# Patient Record
Sex: Male | Born: 1955 | Race: Black or African American | Hispanic: No
Health system: Midwestern US, Community
[De-identification: ages and names within clinical notes are randomized; demographics above are authoritative.]

## PROBLEM LIST (undated history)

## (undated) DIAGNOSIS — R1310 Dysphagia, unspecified: Secondary | ICD-10-CM

## (undated) DIAGNOSIS — I639 Cerebral infarction, unspecified: Secondary | ICD-10-CM

## (undated) DIAGNOSIS — M1990 Unspecified osteoarthritis, unspecified site: Secondary | ICD-10-CM

## (undated) DIAGNOSIS — Z86010 Personal history of colonic polyps: Principal | ICD-10-CM

## (undated) HISTORY — PX: HEMORROIDECTOMY: SUR656

## (undated) HISTORY — PX: TOE SURGERY: SHX1073

## (undated) HISTORY — PX: HERNIA REPAIR: SHX51

## (undated) HISTORY — DX: Personal history of colonic polyps: Z86.010

## (undated) HISTORY — PX: COLONOSCOPY: SHX174

## (undated) HISTORY — DX: Unspecified osteoarthritis, unspecified site: M19.90

---

## 2013-08-10 NOTE — Progress Notes (Signed)
Chief Complaint   Patient presents with   ??? New Patient   ??? 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     1. When and where did you last receive medical care? In the 1990s [facility] [place] for general physical    2.Have you had preventive care such as mammogram, pap smears or colon screening? Yes, approx 5 to 6 years    3. What is your current living situation (for example, live alone, live in home with immediate family members)? [facility]    4. Do you have any problems with communication such trouble seeing, hearing, or understanding instructions?No    5. Do you have an advance directive?  This is a document that you can give to family members with instructions for how you would want them to make health care decisions for you if you were unable to speak for yourself.  (For example, unconscious, delerious) No    PMH/FH/Social Hx reviewed and updated as needed     Applicable screenings reviewed and updated as needed  Medication reconciliation performed. Patient does not know if he needs medication refills.  Health Maintenance reviewed.

## 2013-08-10 NOTE — Progress Notes (Signed)
Discharge instructions reviewed with patient    Medication list and understanding of medications reviewed with patient.   OTC and herbal medications reviewed and added to med list if applicable  Barriers to adherence assessed.    Guidance given regarding new medications this visit, including reason for taking this medicine, and common side effects.

## 2013-08-10 NOTE — Progress Notes (Signed)
HPI  Ryan Warner is a 58 y.o. male being seen today for chronic pain due to history of polio causing left leg emaciation, chronic knee pain, unstable gait.    Chief Complaint   Patient presents with   ??? New Patient   ??? 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   .  he states that see HPI    Past Medical History   Diagnosis Date   ??? Arthritis 2003   ??? Polio 1988   ??? Asbestos exposure          ROS  Patient states that he is feeling well. Denies complaints of chest pain, shortness of breath, swelling of legs, dizziness or weakness. he denies nausea, vomiting or diarrhea.        Current Outpatient Prescriptions   Medication Sig   ??? aspirin (ASPIRIN) 325 mg tablet Take 325 mg by mouth daily.     No current facility-administered medications for this visit.       PE  BP 126/85    Pulse 77    Temp(Src) 97.5 ??F (36.4 ??C) (Oral)    Resp 18    Ht 5\' 10"  (1.778 m)    Wt 167 lb (75.751 kg)    BMI 23.96 kg/m2      SpO2 99%      Alert and oriented with normal mood and affect. he is well developed and well nourished . Lungs are clear without wheezing. Heart rate is regular without murmurs or gallops. There is no lower extremity edema.     No results found for this or any previous visit.      Assessment and Plan:        ICD-9-CM   1. Flat foot 734   2. 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 Chronic knee pain 719.46           Cordella RegisterAmanda K Lyza Houseworth, NP

## 2015-03-14 ENCOUNTER — Emergency Department: Admit: 2015-03-14 | Payer: Self-pay

## 2015-03-14 ENCOUNTER — Inpatient Hospital Stay
Admit: 2015-03-14 | Discharge: 2015-03-16 | Disposition: A | Discharge status: Home / Self Care | Payer: Self-pay | Attending: Internal Medicine | Admitting: Internal Medicine

## 2015-03-14 DIAGNOSIS — I639 Cerebral infarction, unspecified: Secondary | ICD-10-CM

## 2015-03-14 LAB — EKG, 12 LEAD, INITIAL
Atrial Rate: 56 {beats}/min
Calculated P Axis: 62 degrees
Calculated R Axis: 39 degrees
Calculated T Axis: 55 degrees
P-R Interval: 350 ms
Q-T Interval: 444 ms
QRS Duration: 88 ms
QTC Calculation (Bezet): 428 ms
Ventricular Rate: 56 {beats}/min

## 2015-03-14 LAB — CBC WITH AUTOMATED DIFF
BASOPHILS: 0.4 % (ref 0–3)
EOSINOPHILS: 1.5 % (ref 0–5)
HCT: 47 % (ref 37.0–50.0)
HGB: 16.1 gm/dl (ref 12.4–17.2)
IMMATURE GRANULOCYTES: 0.2 % (ref 0.0–3.0)
LYMPHOCYTES: 43.8 % (ref 28–48)
MCH: 30.8 pg (ref 23.0–34.6)
MCHC: 34.3 gm/dl (ref 30.0–36.0)
MCV: 90 fL (ref 80.0–98.0)
MONOCYTES: 6.8 % (ref 1–13)
MPV: 8.2 fL (ref 6.0–10.0)
NEUTROPHILS: 47.3 % (ref 34–64)
NRBC: 0 (ref 0–0)
PLATELET: 237 10*3/uL (ref 140–450)
RBC: 5.22 M/uL (ref 3.80–5.70)
RDW-SD: 47.3 — ABNORMAL HIGH (ref 35.1–43.9)
WBC: 5.5 10*3/uL (ref 4.0–11.0)

## 2015-03-14 LAB — POC TROPONIN: Troponin-I: 0 ng/ml (ref 0.00–0.07)

## 2015-03-14 LAB — POC CHEM8
BUN: 11 mg/dl (ref 7–25)
CALCIUM,IONIZED: 4.6 mg/dL (ref 4.40–5.40)
CO2, TOTAL: 26 mmol/L (ref 21–32)
Chloride: 98 mEq/L (ref 98–107)
Creatinine: 1.3 mg/dl (ref 0.6–1.3)
Glucose: 91 mg/dL (ref 74–106)
HCT: 51 % (ref 40–54)
HGB: 17.3 gm/dl — ABNORMAL HIGH (ref 12.4–17.2)
Potassium: 3.7 mEq/L (ref 3.5–4.9)
Sodium: 140 mEq/L (ref 136–145)

## 2015-03-14 LAB — GLUCOSE, POC: Glucose (POC): 88 mg/dL (ref 65–105)

## 2015-03-14 MED ORDER — ASPIRIN 325 MG TAB
325 mg | Freq: Every day | ORAL | Status: DC
Start: 2015-03-14 — End: 2015-03-16
  Administered 2015-03-15 – 2015-03-16 (×2): via ORAL

## 2015-03-14 MED ORDER — OXYCODONE-ACETAMINOPHEN 5 MG-325 MG TAB
5-325 mg | ORAL | Status: DC | PRN
Start: 2015-03-14 — End: 2015-03-16

## 2015-03-14 MED ORDER — NALOXONE 0.4 MG/ML INJECTION
0.4 mg/mL | INTRAMUSCULAR | Status: DC | PRN
Start: 2015-03-14 — End: 2015-03-16

## 2015-03-14 MED ORDER — SODIUM CHLORIDE 0.9 % IJ SYRG
INTRAMUSCULAR | Status: DC | PRN
Start: 2015-03-14 — End: 2015-03-16

## 2015-03-14 MED ORDER — NICOTINE 14 MG/24 HR DAILY PATCH
14 mg/24 hr | TRANSDERMAL | Status: DC
Start: 2015-03-14 — End: 2015-03-16

## 2015-03-14 MED ORDER — INSULIN LISPRO 100 UNIT/ML INJECTION
100 unit/mL | Freq: Four times a day (QID) | SUBCUTANEOUS | Status: DC
Start: 2015-03-14 — End: 2015-03-16

## 2015-03-14 MED ORDER — SODIUM CHLORIDE 0.9 % IJ SYRG
Freq: Three times a day (TID) | INTRAMUSCULAR | Status: DC
Start: 2015-03-14 — End: 2015-03-16
  Administered 2015-03-15 – 2015-03-16 (×5): via INTRAVENOUS

## 2015-03-14 MED ORDER — SODIUM CHLORIDE 0.9 % IJ SYRG
Freq: Once | INTRAMUSCULAR | Status: AC
Start: 2015-03-14 — End: 2015-03-14
  Administered 2015-03-14: 17:00:00 via INTRAVENOUS

## 2015-03-14 NOTE — Progress Notes (Signed)
Handoff to receiving nurse, Joon.

## 2015-03-14 NOTE — H&P (Signed)
Medicine History and Physical    Patient: Ryan Warner   Age:  59 y.o.    Chief Complaint:   Chief Complaint   Patient presents with   ??? Numbness       ZOX:WRUE    Code Status: FULL    Anticipated Date of Discharge: 2 days   Anticipated Disposition (home, SNF) : home    HPI:   Ryan Warner is a 59  year old man with a medical history significant for  Arthritis , who is being admitted for progressively worsening left arm numbness and difficulty muscle coordination (pt states that he is having difficulty holding cigarrett by left hand) intially started about a week ago and got worse to the point made him come to ER. In ER CT head was done showed . Focal right parietal lesion which could indicate metastasis, focal cerebritis, or primary neoplasm versus unusual infarct.. Central zone of petechial hemorrhage not excluded. Correlation with contrast MRI may be helpful for further evaluation. Neurologist Dr. Rosana Hoes was called- will see pt soon. Pt denies any focal weakness, fever, chills, N,V,D, abdominal pain. He is being admitted for further eval and work up.     Past Medical History:  Past Medical History   Diagnosis Date   ??? Arthritis 2003   ??? Asbestos exposure    ??? Polio 1988       Past Surgical History:  Past Surgical History   Procedure Laterality Date   ??? Hx orthopaedic Right      Hammertoe surgery, multiple foot operations   ??? Hx hemorrhoidectomy  1978       Family History:  Family History   Problem Relation Age of Onset   ??? Alcohol abuse Mother    ??? Liver Disease Mother    ??? Alcohol abuse Father    ??? Sudden Death Brother      SIDS   ??? Alcohol abuse Brother        Social History:  Social History     Social History   ??? Marital status: WIDOWED     Spouse name: N/A   ??? Number of children: N/A   ??? Years of education: N/A     Social History Main Topics   ??? Smoking status: Current Every Day Smoker     Years: 45.00     Types: Cigarettes   ??? Smokeless tobacco: Never Used      Comment: 4 to 5 cigarettes per day    ??? Alcohol use No   ??? Drug use: Yes      Comment: Crack   ??? Sexual activity: Not Asked     Other Topics Concern   ??? None     Social History Narrative       Home Medications:  Prior to Admission medications    Medication Sig Start Date End Date Taking? Authorizing Provider   aspirin (ASPIRIN) 325 mg tablet Take 325 mg by mouth daily.    Historical Provider       Allergies:  No Known Allergies    Review of Systems  12 systems reviewed - all negative except for what is noted in HPI.  Pertinent items are noted in the History of Present Illness.  remainder of ten point review of systems reviewed with patient and negative    Physical Exam:     Visit Vitals   ??? BP 114/82   ??? Pulse 73   ??? Temp 97.5 ??F (36.4 ??C)   ???  Resp 18   ??? Ht 5\' 9"  (1.753 m)   ??? Wt 77.1 kg (170 lb)   ??? SpO2 99%   ??? BMI 25.1 kg/m2       Physical Exam:  General appearance: Alert, cooperative, no distress, appears stated age  Head: Normocephalic, without obvious abnormality, atraumatic  Neck: Supple, no lymphadenopathy or thyromegaly, trachea midline  Lungs: Clear to auscultation bilaterally  Heart: Regular rate and rhythm, S1, S2 normal, no murmur, click, rub or gallop  Abdomen: Soft, non-tender and not-distended. Bowel sounds normal. No masses,  no organomegaly  Extremities: Extremities normal, atraumatic, no cyanosis or edema  Skin: Skin color, texture, turgor normal. No rashes or lesions  Neurologic: Grossly normal and non focal, normal muscle tone and power, left arm touble coordinating- holding pen etc- no obvious weakness, cerebellar function intact    Intake and Output:  Current Shift:     Last three shifts:       Lab/Data Reviewed:  All lab results for the last 24 hours reviewed.    Assessment/Plan   Active Problems:    Precordial chest pain (03/14/2015)      Left arm numbness (03/14/2015)      Abnormal head CT (03/14/2015)      **left arm numbness  - no focal weakness  - CT head- . Focal right parietal lesion which could indicate metastasis,  focal  cerebritis, or primary neoplasm versus unusual infarct.. Central zone of  petechial hemorrhage not excluded. Correlation with contrast MRI may be helpful  for further evaluation.   - Neurology Dr. Rosana HoesSheth notified by ER  - will get MRI brain  - admit to telemetry  - IVF for hydration  - neuro checks q4 hrs  - no obvious surrounding edema so will hold off on steroid     **Active smoker  - The patient was counseled on the dangers of tobacco use, and was advised to quit.  Reviewed strategies to maximize success, including removing cigarettes and smoking materials from environment.      Time Spent:    Raenette RoverAjay G Jashad Depaula, MD  March 14, 2015      Hospital Medicine  [facility]  Pager: [phone]

## 2015-03-14 NOTE — Other (Signed)
TRANSFER - OUT REPORT:    Verbal report given to [place], RN on Glenice Lainehomas M Clawson  being transferred to 6732(unit) for routine progression of care       Report consisted of patient???s Situation, Background, Assessment and   Recommendations(SBAR).     Information from the following report(s) ED Summary was reviewed with the receiving nurse.    Opportunity for questions and clarification was provided.      Patient transported with:   [employer]

## 2015-03-14 NOTE — Other (Signed)
Bedside and Verbal shift change report given to Joon (Cabin crewoncoming nurse) by Sherrilyn RistKari (offgoing nurse). Report included the following information SBAR, Kardex, MAR and Cardiac Rhythm SB.

## 2015-03-14 NOTE — Progress Notes (Signed)
Notified of Stroke referral : LKW 10/16 with c/o L arm weakness.requested Hollie to complete NIHSS and swallow screen if appropriate. No acute Stroke intervention at this time. Monitor for disposition.

## 2015-03-14 NOTE — ED Triage Notes (Signed)
Pt reports having numbness to left arm, hand that started 4 days ago. Pt reports falling yesterday at home , but had been able to ambulate.

## 2015-03-14 NOTE — Other (Signed)
----------  DocumentID:   [id number]------------------------------------------------              [facility]                       Patient Education Report         Name: Ryan BeckwithBELL, Ryan Warner                  Date: 03/14/2015    MRN: [id number]                    Time: 6:47:34 PM         Patient ordered video: Patient Safety: Stay Safe While you are in the   Hospital    from [phone] via phone number: [phone] at 6:47:34 PM

## 2015-03-14 NOTE — Progress Notes (Signed)
Completed bilateral carotid artery duplex: The right and left internal carotid arteries velocities are suggestive of <50%stenosis. Final report to follow.  Larry Chevallier.RVS

## 2015-03-14 NOTE — ED Notes (Signed)
Pt presents with c/o "numbness" to his left hand and weakness radiating to left shoulder x 4 days.  Pt states he "got dizzy" yesterday and fell, denies hitting head or loss of consciousness, and states he "caught himself".  Grip noted slightly weaker on left than right.  Denies HA or pain.

## 2015-03-14 NOTE — Progress Notes (Signed)
Pt admitted from the ER in no distress, oriented to room and medical equipment, tele monitor on box 52 confirmed by Cassandra with [facility], vs taken and recorded, tigr video shown.

## 2015-03-14 NOTE — ED Notes (Signed)
Stroke team notified.

## 2015-03-14 NOTE — ED Notes (Signed)
[facility]  Emergency Department Treatment Report        Patient: Ryan Warner Age: 59 y.o. Sex: male    Date of Birth: January 30, 1956 Admit Date: 03/14/2015 PCP: None   MRN: [id number]  CSN: [id number]     Room: ER41/ER41 Time Dictated: 1:53 PM      I hereby certify this patient for admission based upon medical necessity as ??  noted below:    Chief Complaint   Numbness    History of Present Illness   59 y.o. male presents the ED with a week of difficulty using the left hand. He denies numbness tingling or pain. He says these just having difficulty coordinating actions with his left hand such as writing. He is left handed. This is never happened to him before. He says that when he squeezes the left hand he gets some left-sided chest pain, but otherwise has no chest pain shortness of breath or pain anywhere. Patient denies head injury or neck injury. Patient denies fevers, cough, abdominal pain, nausea, vomiting. Had an episode of dizziness that he describes as feeling off balance yesterday, to the point where he fell over, but then "caught himself". Patient admits to being a smoker of tobacco, as well as smoking crack cocaine. Patient does not use medications regularly for medical problems. Patient has never had a heart attack or stroke.    Review of Systems   Review of Systems   Constitutional: Negative.    HENT: Negative.    Eyes: Negative.    Respiratory: Negative.    Cardiovascular: Positive for chest pain. Negative for leg swelling.   Gastrointestinal: Negative.    Musculoskeletal: Positive for falls. Negative for neck pain.   Skin: Negative.    Neurological: Positive for dizziness. Negative for focal weakness and loss of consciousness.   Endo/Heme/Allergies: Negative.        Past Medical/Surgical History     Past Medical History   Diagnosis Date   ??? Arthritis 2003   ??? Asbestos exposure    ??? Polio 1988     Past Surgical History   Procedure Laterality Date   ??? Hx orthopaedic Right       Hammertoe surgery, multiple foot operations   ??? Hx hemorrhoidectomy  1978       Social History     Social History     Social History   ??? Marital status: WIDOWED     Spouse name: N/A   ??? Number of children: N/A   ??? Years of education: N/A     Occupational History   ??? Not on file.     Social History Main Topics   ??? Smoking status: Current Every Day Smoker     Years: 45.00     Types: Cigarettes   ??? Smokeless tobacco: Never Used      Comment: 4 to 5 cigarettes per day   ??? Alcohol use No   ??? Drug use: Yes      Comment: Crack   ??? Sexual activity: Not on file     Other Topics Concern   ??? Not on file     Social History Narrative       Family History     Family History   Problem Relation Age of Onset   ??? Alcohol abuse Mother    ??? Liver Disease Mother    ??? Alcohol abuse Father    ??? Sudden Death Brother      SIDS   ???  Alcohol abuse Brother        Current Medications     Current Facility-Administered Medications   Medication Dose Route Frequency Provider Last Rate Last Dose   ??? sodium chloride (NS) flush 5-10 mL  5-10 mL IntraVENous ONCE Elray BubaSarah E Gregory, PA-C         Current Outpatient Prescriptions   Medication Sig Dispense Refill   ??? aspirin (ASPIRIN) 325 mg tablet Take 325 mg by mouth daily.         Allergies   No Known Allergies    Physical Exam   ED Triage Vitals   Enc Vitals Group      BP 03/14/15 1052 148/102      Pulse (Heart Rate) 03/14/15 1052 73      Resp Rate 03/14/15 1052 18      Temp 03/14/15 1052 97.5 ??F (36.4 ??C)      Temp src --       O2 Sat (%) 03/14/15 1052 99 %      Weight 03/14/15 1052 170 lb      Height 03/14/15 1052 5\' 9"       Head Cir --       Peak Flow --       Pain Score --       Pain Loc --       Pain Edu? --       Excl. in GC? --      Physical Exam   Constitutional: He is oriented to person, place, and time and well-developed, well-nourished, and in no distress.   HENT:   Head: Normocephalic and atraumatic.   Right Ear: External ear normal.   Left Ear: External ear normal.   Nose: Nose normal.    Mouth/Throat: Oropharynx is clear and moist.   Eyes: Conjunctivae and EOM are normal. Pupils are equal, round, and reactive to light. Right eye exhibits no discharge. Left eye exhibits no discharge. No scleral icterus.   Neck: Normal range of motion. Neck supple. No tracheal deviation present.   Cardiovascular: Normal rate, regular rhythm and normal heart sounds.  Exam reveals no gallop and no friction rub.    No murmur heard.  Pulmonary/Chest: Effort normal and breath sounds normal. No stridor. He has no wheezes. He has no rales.   Abdominal: Soft. Bowel sounds are normal. He exhibits no distension. There is no rebound and no guarding.   Musculoskeletal: Normal range of motion. He exhibits no edema, tenderness or deformity.   Neurological: He is alert and oriented to person, place, and time. No cranial nerve deficit. Coordination normal.   Pt with sensation change of left hand, 3rd digit, palmar and dorsal side, compared to right hand, 3rd digit.  However, sensation intact in all fingers on b/l hands.    Skin: Skin is warm and dry. No rash noted. No erythema.        Impression and Management Plan   Patient presents the ED with concerning neurologic symptoms of unable to be coordinated with his dominant hand, the left hand. He also had dizziness feeling like he was off balance. He's had no head injury or loss of consciousness. Concern for acute stroke.  Patient also with left-sided chest pain, and history of crack cocaine smoking, most recently yesterday. Patient does not associate symptoms with crack cocaine smoking. However we cannot rule out ACS caused by cocaine abuse.  We will obtain basic labs including a troponin, EKG, chest x-ray, head CT.    Diagnostic Studies  Lab:   Recent Results (from the past 12 hour(s))   CBC WITH AUTOMATED DIFF    Collection Time: 03/14/15  1:42 PM   Result Value Ref Range    WBC 5.5 4.0 - 11.0 1000/mm3    RBC 5.22 3.80 - 5.70 M/uL    HGB 16.1 12.4 - 17.2 gm/dl     HCT 16.1 09.6 - 04.5 %    MCV 90.0 80.0 - 98.0 fL    MCH 30.8 23.0 - 34.6 pg    MCHC 34.3 30.0 - 36.0 gm/dl    PLATELET 409 811 - 914 1000/mm3    MPV 8.2 6.0 - 10.0 fL    RDW-SD 47.3 (H) 35.1 - 43.9      NRBC 0 0 - 0      IMMATURE GRANULOCYTES 0.2 0.0 - 3.0 %    NEUTROPHILS 47.3 34 - 64 %    LYMPHOCYTES 43.8 28 - 48 %    MONOCYTES 6.8 1 - 13 %    EOSINOPHILS 1.5 0 - 5 %    BASOPHILS 0.4 0 - 3 %     Labs Reviewed   CBC WITH AUTOMATED DIFF - Abnormal; Notable for the following:        Result Value    RDW-SD 47.3 (*)     All other components within normal limits   POC TROPONIN-I       Imaging:    CT scan of the head revealing focal right parietal lesion which could indicate metastasis, focal cerebritis or primary neoplasm versus unusual infarct. Central zone of petechial hemorrhage not excluded. Per radiology    Chest x-ray no acute cardiopulmonary disease per radiology    Continuation by Haze Justin, M.D.  ED Course   Patient observed in the emergency department without further complaints or medical problem. Patient does have complaints of persistent left arm numbness and discoordination  Medical Decision Making   Patient complaining of chest pain with a negative initial workup. Patient with left arm numbness and discoordination with an abnormal brain CT. I discussed patient with neurology who will see patient in consultation and will determine further testing.  Final Diagnosis   #1 Left arm numbness and discoordination #2 abnormal brain CT #3 precordial chest pain  Disposition   Admit to telemetry. I discussed patient with Dr. Matthew Saras of [facility] who will admit patient to telemetry discussed patient with neurology who will see patient consultation    Johny Blamer  March 14, 2015     Haze Justin, M.D.    My signature above authenticates this document and my orders, the final ??  diagnosis (es), discharge prescription (s), and instructions in the Epic ??  record.   If you have any questions please contact [phone].  ??  Nursing notes have been reviewed by the physician/ advanced practice ??  Clinician.

## 2015-03-15 ENCOUNTER — Inpatient Hospital Stay: Payer: Self-pay

## 2015-03-15 LAB — METABOLIC PANEL, BASIC
BUN: 11 mg/dl (ref 7–25)
CO2: 31 mEq/L (ref 21–32)
Calcium: 8.4 mg/dl — ABNORMAL LOW (ref 8.5–10.1)
Chloride: 102 mEq/L (ref 98–107)
Creatinine: 1.1 mg/dl (ref 0.6–1.3)
GFR est AA: >60
GFR est non-AA: >60
Glucose: 87 mg/dl (ref 74–106)
Potassium: 3.3 mEq/L — ABNORMAL LOW (ref 3.5–5.1)
Sodium: 142 mEq/L (ref 136–145)

## 2015-03-15 LAB — TROPONIN I
Troponin-I: 0.028 ng/ml (ref 0.000–0.045)
Troponin-I: 0.022 ng/ml (ref 0.000–0.045)

## 2015-03-15 LAB — TSH 3RD GENERATION: TSH: 2.6 u[IU]/mL (ref 0.358–3.740)

## 2015-03-15 LAB — LIPID PANEL
CHOL/HDL Ratio: 5.2 Ratio — ABNORMAL HIGH (ref 0.0–5.0)
Cholesterol, total: 206 mg/dl — ABNORMAL HIGH (ref 140–199)
HDL Cholesterol: 40 mg/dl (ref 40–96)
LDL, calculated: 128 mg/dl (ref 0–130)
Triglyceride: 192 mg/dl — ABNORMAL HIGH (ref 29–150)

## 2015-03-15 LAB — GLUCOSE, POC
Glucose (POC): 115 mg/dL — ABNORMAL HIGH (ref 65–105)
Glucose (POC): 105 mg/dL (ref 65–105)
Glucose (POC): 85 mg/dL (ref 65–105)
Glucose (POC): 138 mg/dL — ABNORMAL HIGH (ref 65–105)

## 2015-03-15 MED ORDER — LORAZEPAM 2 MG/ML IJ SOLN
2 mg/mL | Freq: Once | INTRAMUSCULAR | Status: DC
Start: 2015-03-15 — End: 2015-03-16

## 2015-03-15 MED ORDER — POTASSIUM CHLORIDE SR 20 MEQ TAB, PARTICLES/CRYSTALS
20 mEq | ORAL | Status: AC
Start: 2015-03-15 — End: 2015-03-15
  Administered 2015-03-15: via ORAL

## 2015-03-15 MED FILL — ASPIRIN 325 MG TAB: 325 mg | ORAL | Qty: 1

## 2015-03-15 MED FILL — KLOR-CON M20 MEQ TABLET,EXTENDED RELEASE: 20 mEq | ORAL | Qty: 2

## 2015-03-15 MED FILL — NICOTINE 14 MG/24 HR DAILY PATCH: 14 mg/24 hr | TRANSDERMAL | Qty: 1

## 2015-03-15 NOTE — Procedures (Signed)
[facility]  Electroencephalogram  NAMDellia Beckwith:  Morini, Wallace   DATE: 03/15/2015  EEG#: [id number]  DOB: Jul 22, 1955  MR#    [id number]  ROOM:  6EST  ACCT#  [id number]  SEX: M  REFERRING PHYSICIAN: Raenette RoverAJAY G NAROLA          DATE OF PROCEDURE  03/15/2015    TECHNIQUE:  Seventeen channels of EEG and 1 channel of EKG were recorded using   international 10/20 system.    CLINICAL HISTORY:  The patient with a history of having left-sided numbness, paresthesias.    MEDICATIONS:  Include aspirin.    BACKGROUND:  The background activity consisted of 9 to 10 Hz rhythmic waveform activity   seen.    ACTIVATION TECHNIQUE:  1.  Hyperventilation -- no change in background.  2.  Photic stimulation -- no photic drive seen.  3.  Sleep -- stage I sleep seen.    CONCLUSION:  Essentially normal EEG.  There was no focal slowing or epileptiform activities   noted.      ___________________  Reece PackerSoham G Darrel Gloss M.D.  Dictated By: .   BB  D:03/15/2015 13:46:04  T: 03/15/2015 15:30:05  [id number]

## 2015-03-15 NOTE — Other (Signed)
Bedside shift change report given to Sherrilyn RistKari and Agricultural consultantCassandra (Cabin crewoncoming nurse) by Tonye RoyaltyJoon (offgoing nurse). Report included the following information SBAR.

## 2015-03-15 NOTE — Other (Signed)
Bedside and Verbal shift change report given to Clemence Mapatac Cyril MourningLarson, RN   (oncoming nurse) by .Sherrilyn RistKari, RN (offgoing nurse). Report included the following information SBAR, Kardex, MAR and Recent Results.

## 2015-03-15 NOTE — Consults (Signed)
[facility]  [facility]  NAME:  Ryan Warner, Ryan Warner  SEX:   M  ADMIT: 03/14/2015  DATE OF CONSULT: 03/15/2015  REFERRING PHYSICIAN:    DOB: 02-27-1956  MR#    [id number]  ROOM:  [id number]  ACCT#  [id number]    cc: Leana GamerAjay Narola MD    REFERRING PHYSICIAN:  Dr. Matthew SarasNarola.    REASON FOR REFERRAL:  Left-sided weakness.    HISTORY OF PRESENT ILLNESS:  The patient, a 59 year old man, presents with a history of having left-sided   paresthesias and difficulty using his left upper extremity.    According to the patient, he has been having difficulties going on for the   last 2 to 3 weeks.  The patient feels that he has a problem holding onto   things with his left hand and he is left-handed.  The patient sometimes does   feel paresthesias.  He feels that his whole left upper extremity is involved.    Sometimes walking he has recently seen that he might be slightly off balance   as well.    However, he denies having any similar symptoms in the past.  He denies having   any history of syncope or seizures.  Denies history of bowel or bladder   incontinence, although he complains of having chronic constipation.    The patient does feel somewhat depressed since the death of his wife about a   couple of years ago.  He does admit to be smoking a lot heavily now.  He also   admits using cocaine intermittently.  Denies any alcohol abuse.    The patient did have a CT of the head done in the ER which was personally   reviewed and did show a right-sided cortical lesion of unclear etiology at   this time.    PAST MEDICAL HISTORY:  Asbestos exposure, question of polio in his legs possibly very mild.    PAST SURGICAL HISTORY:  Orthopedic surgery, hemorrhoidectomy.    FAMILY HISTORY:  Alcohol abuse and liver disease in the mother, alcohol abuse in the father.    SOCIAL HISTORY:  History of heavy smoking, past history of occasional cocaine abuse as well.    HOME MEDICATIONS:  Aspirin.    ALLERGIES:  NO KNOWN DRUG ALLERGIES.     REVIEW OF SYSTEMS:  CONSTITUTIONAL:  No fevers or chills.  EYES:  No double vision.  ENT:  No hearing or swallowing difficulties.  RESPIRATORY:  No breathing difficulties.  CARDIOVASCULAR:  No chest pain or palpitations.  GASTROINTESTINAL:  No abdominal pain, nausea or vomiting.  GENITOURINARY:  No bowel or bladder incontinence.  MUSCULOSKELETAL:  No joint pain.  PSYCHIATRIC:  No history of depression.  NEUROLOGIC:  History of left-sided weakness.    PHYSICAL EXAMINATION:  VITAL SIGNS:  Temperature 98, pulse of 59, blood pressure 110/74.  GENERAL:  The patient is well built and well nourished.  RESPIRATORY:  Bilateral breath sounds present.  Lungs clear to auscultation.    NEUROLOGICAL EXAMINATION:  MENTAL STATUS:  The patient is alert and awake.  Speech and language intact.    Attention and concentration normal.  Repetition normal.  CRANIAL NERVE EXAMINATION:  Both pupils reactive.  Full conjugate movements.    Field of vision normal.  Sensation of face symmetrical.  No facial weakness.    Tongue midline.  Hearing to finger rub normal.  Palatal movements symmetrical.    Shoulder shrugs are normal.  MOTOR EXAMINATION:  Left drift.  Left  upper extremity seems to be mildly weak   with strength of about 4.  Left lower extremity strength seems to be almost 5.    Mild dysmetria on the left.  Right side seems to be normal.  SENSORY EXAMINATION:  Sensation to temperature seems to be symmetrical.  DEEP TENDON REFLEXES:  Bilaterally brisk, especially at the knees but ankles   seem to be 0.    IMPRESSION AND PLAN:  The patient presents with a history of having left-sided weakness, difficulty   with coordination.  This does seem more central and could be related to his   right-sided right hemispheric lesion seen on the CAT scan.  However, given   bilateral brisk reflexes at the knees, cervical spine etiology needs to be   ruled out as well.    At this time, we will get MRI of the brain and the cervical spine.  based on    the MRI results, we will consider further workup.  Given the subacute nature   of the different etiologies, remains in the differential.  He also has   intermittent cocaine abuse as well.  I would also recommend to rule out any   HIV as well.    Consider PT/OT evaluation.  Continue neuro checks.  Seizure precautions.    Thank you for allowing me to participate in the care of the patient.  Thank   you for the consultation.      ___________________  Reece Packer M.D.  Dictated By:Marland Kitchen   DCD  D:03/15/2015 08:10:09  T: 03/15/2015 08:38:10  [id number]

## 2015-03-15 NOTE — Consults (Signed)
Consult dictated    PLAN:  - MRI brain  - MRI cervical spine  - EEG  - further workup based on MRI results  - Dr. Allena KatzPatel is taking over neurology service this afternoon and will follow the patient.

## 2015-03-15 NOTE — Progress Notes (Signed)
Loyda from eeg at bedside

## 2015-03-15 NOTE — Progress Notes (Signed)
Problem: Falls - Risk of  Goal: *Absence of falls  Outcome: Progressing Towards Goal  Gripper socks, armband applied, sign on door, call Sigel within reach

## 2015-03-15 NOTE — Procedures (Signed)
[facility]  Electroencephalogram  NAME:  [name], [name]   DATE: 03/15/2015  EEG#: [id number]  DOB: 11/16/1955  MR#    [id number]  ROOM:  6EST  ACCT#  [id number]  SEX: M  REFERRING PHYSICIAN: AJAY G NAROLA          DATE OF PROCEDURE  03/15/2015    TECHNIQUE:  Seventeen channels of EEG and 1 channel of EKG were recorded using   international 10/20 system.    CLINICAL HISTORY:  The patient with a history of having left-sided numbness, paresthesias.    MEDICATIONS:  Include aspirin.    BACKGROUND:  The background activity consisted of 9 to 10 Hz rhythmic waveform activity   seen.    ACTIVATION TECHNIQUE:  1.  Hyperventilation -- no change in background.  2.  Photic stimulation -- no photic drive seen.  3.  Sleep -- stage I sleep seen.    CONCLUSION:  Essentially normal EEG.  There was no focal slowing or epileptiform activities   noted.      ___________________  Coby Shrewsberry G Wesleigh Markovic M.D.  Dictated By: .   BB  D:03/15/2015 13:46:04  T: 03/15/2015 15:30:05  [id number]

## 2015-03-15 NOTE — Progress Notes (Signed)
Return to unit from mri no distress. Mri not done patient states clausterphobic. Dr Matthew Sarasnarola paged for pre medication awaiting md call back

## 2015-03-15 NOTE — Progress Notes (Signed)
Daily Progress Note    Patient: Ryan Warner   Age:  59 y.o.  DOA: 03/14/2015   Admit Dx / CC: Left arm numbness  Precordial chest pain  Abnormal head CT  Brain lesion  LOS:  LOS: 1 day       Anticipated Date of Discharge: tomrorow depends on MRI results   Anticipated Disposition (home, SNF) : home     Assessment/ Plan:      Assessment/Plan:     IMPRESSION:       Patient Active Problem List   Diagnosis Code   ??? Chronic knee pain M25.569, G89.29   ??? 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 Flat foot M21.40   ??? Precordial chest pain R07.2   ??? Left arm numbness R20.0   ??? Abnormal head CT R93.0   ??? Brain lesion G93.9      PLAN:     **left arm numbnessand trouble coordination  - no focal weakness  - CT head- . Focal right parietal lesion which could indicate metastasis, focal  cerebritis, or primary neoplasm versus unusual infarct.. Central zone of  petechial hemorrhage not excluded. Correlation with contrast MRI may be helpful  for further evaluation. ??  - Neurology Dr. Rosana HoesSheth following  - carotid dopler and echo reviewed- no acute pathology  - MRI brain pending   - IVF for hydration  - neuro checks q4 hrs  - no obvious surrounding edema so will hold off on steroid   ??      Subjective:  Subjective:    Pt seen and examined. Trouble coordinating with left hand and having on-off numbness. No fever     Objective:   Patient Vitals for the past 24 hrs:   Temp Pulse Resp BP SpO2   03/15/15 1146 98 ??F (36.7 ??C) (!) 56 16 104/76 99 %   03/15/15 0757 98 ??F (36.7 ??C) (!) 59 16 110/74 99 %   03/15/15 0405 98 ??F (36.7 ??C) 71 14 106/62 98 %   03/15/15 0007 97.9 ??F (36.6 ??C) 63 16 103/72 100 %   03/14/15 1952 98.1 ??F (36.7 ??C) 74 20 127/87 100 %   03/14/15 1846 97.7 ??F (36.5 ??C) (!) 52 18 136/77 100 %       Intake and Output:  Current Shift:  10/21 0701 - 10/21 1900  In: 840 [P.O.:840]  Out: -   Last three shifts:       Lab/Data Review:    Recent Labs      03/14/15   1526  03/14/15    1342   WBC   --   5.5   HGB  17.3*  16.1   HCT  51  47.0   PLT   --   237     Recent Labs      03/15/15   0257  03/14/15   1526   NA  142  140   K  3.3*  3.7   CL  102  98   CO2  31  26   GLU  87  91   BUN  11  11   CREA  1.1  1.3   CA  8.4*   --      No results for input(s): PH, PCO2, PO2, HCO3, FIO2 in the last 72 hours.    Medications:     Current Facility-Administered Medications   Medication Dose Route Frequency   ???  aspirin (ASPIRIN) tablet 325 mg  325 mg Oral DAILY   ??? sodium chloride (NS) flush 5-10 mL  5-10 mL IntraVENous Q8H   ??? sodium chloride (NS) flush 5-10 mL  5-10 mL IntraVENous PRN   ??? oxyCODONE-acetaminophen (PERCOCET) 5-325 mg per tablet 1 Tab  1 Tab Oral Q4H PRN   ??? naloxone (NARCAN) injection 0.1 mg  0.1 mg IntraVENous PRN   ??? insulin lispro (HUMALOG) injection   SubCUTAneous AC&HS   ??? nicotine (NICODERM CQ) 14 mg/24 hr patch 1 Patch  1 Patch TransDERmal Q24H       Echo-The estimated left ventricular ejection fraction is 70%.  Based on the transmitral spectral Doppler flow pattern the diastolic function  is normal for age.  There is mild concentric left ventricular hypertrophy.  No significant regurgitation  Based on the peak tricuspid regurgitation velocity the estimated right  ventricular systolic pressure is 18 mmHg.  Injection of Saline Contrast was negative for interatrial shunting.  No previous for comparison    Physical Exam:  General appearance: Alert, cooperative, no distress, appears stated age  Head: Normocephalic, without obvious abnormality, atraumatic  Neck: Supple, no lymphadenopathy or thyromegaly, trachea midline  Lungs: Clear to auscultation bilaterally  Heart: Regular rate and rhythm, S1, S2 normal, no murmur, click, rub or gallop  Abdomen: Soft, non-tender and not-distended. Bowel sounds normal. No masses, no organomegaly  Extremities: Extremities normal, atraumatic, no cyanosis or edema  Skin: Skin color, texture, turgor normal. No rashes or lesions   Neurologic: left arm numbness and trouble coordination    All labs and radiographic findings reviewed.  All meds reviewed.    I have spent 25 minutes examining the patient, obtaining history, reviewing lab values, medications, radiographic data, vital signs. Over half this time has been direct patient contact.     Raenette Rover, MD  03/15/2015      Hospitalist Service  [facility]

## 2015-03-15 NOTE — Progress Notes (Signed)
Pt transported to MRI in no distress

## 2015-03-16 ENCOUNTER — Inpatient Hospital Stay: Admit: 2015-03-16 | Payer: Self-pay

## 2015-03-16 LAB — METABOLIC PANEL, BASIC
BUN: 12 mg/dl (ref 7–25)
CO2: 29 mEq/L (ref 21–32)
Calcium: 8.9 mg/dl (ref 8.5–10.1)
Chloride: 106 mEq/L (ref 98–107)
Creatinine: 1.3 mg/dl (ref 0.6–1.3)
GFR est AA: >60
GFR est non-AA: 60
Glucose: 104 mg/dl (ref 74–106)
Potassium: 4.1 mEq/L (ref 3.5–5.1)
Sodium: 143 mEq/L (ref 136–145)

## 2015-03-16 LAB — CBC WITH AUTOMATED DIFF
BASOPHILS: 0.7 % (ref 0–3)
EOSINOPHILS: 1.4 % (ref 0–5)
HCT: 44.8 % (ref 37.0–50.0)
HGB: 15.3 gm/dl (ref 12.4–17.2)
IMMATURE GRANULOCYTES: 0 % (ref 0.0–3.0)
LYMPHOCYTES: 45.4 % (ref 28–48)
MCH: 30.4 pg (ref 23.0–34.6)
MCHC: 34.2 gm/dl (ref 30.0–36.0)
MCV: 88.9 fL (ref 80.0–98.0)
MONOCYTES: 8.6 % (ref 1–13)
MPV: 8.3 fL (ref 6.0–10.0)
NEUTROPHILS: 43.9 % (ref 34–64)
NRBC: 0 (ref 0–0)
PLATELET: 237 10*3/uL (ref 140–450)
RBC: 5.04 M/uL (ref 3.80–5.70)
RDW-SD: 45.1 — ABNORMAL HIGH (ref 35.1–43.9)
WBC: 4.4 10*3/uL (ref 4.0–11.0)

## 2015-03-16 LAB — GLUCOSE, POC
Glucose (POC): 109 mg/dL — ABNORMAL HIGH (ref 65–105)
Glucose (POC): 102 mg/dL (ref 65–105)
Glucose (POC): 103 mg/dL (ref 65–105)

## 2015-03-16 LAB — C REACTIVE PROTEIN, QT: C-Reactive protein: <2.9 mg/L (ref 0.0–2.9)

## 2015-03-16 LAB — SED RATE (ESR): Sed rate (ESR): 5 mm/Hr (ref 0–20)

## 2015-03-16 MED ORDER — ATORVASTATIN 40 MG TAB
40 mg | ORAL_TABLET | Freq: Every evening | ORAL | 0 refills | Status: AC
Start: 2015-03-16 — End: ?

## 2015-03-16 MED ORDER — ATORVASTATIN 40 MG TAB
40 mg | ORAL_TABLET | Freq: Every evening | ORAL | 0 refills | Status: DC
Start: 2015-03-16 — End: 2015-03-16

## 2015-03-16 MED ORDER — LORAZEPAM 2 MG/ML IJ SOLN
2 mg/mL | INTRAMUSCULAR | Status: AC
Start: 2015-03-16 — End: 2015-03-16
  Administered 2015-03-16: 14:00:00 via INTRAVENOUS

## 2015-03-16 MED ORDER — ATORVASTATIN 40 MG TAB
40 mg | Freq: Every evening | ORAL | Status: DC
Start: 2015-03-16 — End: 2015-03-16

## 2015-03-16 MED ORDER — GADOBUTROL 1 MMOL/ML (604.72 MG/ML) IV
1 mmol/mL (604.72 mg/mL) | Freq: Once | INTRAVENOUS | Status: AC
Start: 2015-03-16 — End: 2015-03-16
  Administered 2015-03-16: 15:00:00 via INTRAVENOUS

## 2015-03-16 MED FILL — NICOTINE 14 MG/24 HR DAILY PATCH: 14 mg/24 hr | TRANSDERMAL | Qty: 1

## 2015-03-16 MED FILL — BD POSIFLUSH NORMAL SALINE 0.9 % INJECTION SYRINGE: INTRAMUSCULAR | Qty: 10

## 2015-03-16 MED FILL — LORAZEPAM 2 MG/ML IJ SOLN: 2 mg/mL | INTRAMUSCULAR | Qty: 1

## 2015-03-16 MED FILL — GADAVIST 1 MMOL/ML (604.72 MG/ML) INTRAVENOUS SOLUTION: 1 mmol/mL (604.72 mg/mL) | INTRAVENOUS | Qty: 9

## 2015-03-16 MED FILL — ASPIRIN 325 MG TAB: 325 mg | ORAL | Qty: 1

## 2015-03-16 NOTE — Progress Notes (Addendum)
Discharge Plan:   home    Discharge Date:     03/16/2015     TCC referral: yes    Med assist list: Lipitor 40mg     Home Health Needed:     yes    Home Health Agency:    [facility]    Confirmed start of care with the home health agency and spoke with:      Email    Transportation: Family

## 2015-03-16 NOTE — Progress Notes (Signed)
Neurology Progress Note    Patient ID:  Ryan Warner  [id number]  60 y.o.  12-16-1955    Subjective:   HISTORY:   59 year old man presents with a history of having left-sided paresthesias and difficulty using his left upper extremity.??  He has been having difficulties going on for the last 2 to 3 weeks. He feels that he has a problem holding onto things with his left hand and he is left-handed. He sometimes does   feel paresthesias. He feels that his whole left upper extremity is involved. Sometimes walking he has recently seen that he might be slightly off balance as well.  He denies having any history of syncope or seizures. Denies history of bowel or bladder incontinence. He feels somewhat depressed since the death of his wife couple of years ago.   He admits to be smoking a lot heavily and using cocaine intermittently but denies alcohol abuse.  CT of the head had shown a right-sided cortical lesion of unclear etiology at this time    PHYSICAL EXAMINATION:   General: Well built, well nourished, in no acute distress  Head: Normocephalic, atraumatic   Eyes: Anicteric, no ptosis  Neck: Supple, non tender  Skin: No rash seen   Musculoskeletal:  Extremities revealed no edema or deformity  Peripheral pulses:  Felt equally on both sides  Psych:  Mood and affect appropriate  ??  NEUROLOGICAL EXAMINATION:   Mental Status: Awake, alert and oriented to person, place, and time. Memory for recent and remote events is intact.   Speech is clear and  Fluent, no aphasia is elicited.    Cranial Nerves:  2 to 12 are intact. Pupils are equal and reactive to light. Visual fields are normal. Extra-ocular movements are full, no ptosis or nystagmus is seen. No facial weakness is seen. Facial sensations are intact on both sides.Hearing is grossly intact. The remainder of cranial nerves are intact  Motor Examination: Mild left hemiparesis, arm +4/5 and leg -5/5 strength, 5/5 muscle strength on the right side, mild left arm drift.     Sensory exam:  Normal to touch, pinprick and temperature on both sides.   Coordination: Left finger to nose dysmetria  Reflexes:  DTRs 2+ bilaterally symmetrical  Planter Reflexes: Bilaterally downgoing.    Tremors: None seen Gait: Not tested    DATA REVIEW:  Carotid Duplex Report  Interpretation Summary  < 50% right internal carotid artery stenosis.  < 50% left internal carotid artery stenosis.  Antegrade flow noted in the vertebral arteries.  The subclavian arteries are within normal limits bilaterally.  Adult Echocardiogram Report  Interpretation Summary  Left ventricular systolic function is normal.  The estimated left ventricular ejection fraction is 70%.  Based on the transmitral spectral Doppler flow pattern the diastolic function is normal for age.  There is mild concentric left ventricular hypertrophy. No significant regurgitation  Based on the peak tricuspid regurgitation velocity the estimated right ventricular systolic pressure is 18 mmHg.  Injection of Saline Contrast was negative for interatrial shunting.    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 Matthew Saras  - he neurologically stable. I will sign off. Please do not hesitate to re consult if reevaluation is necessary.  - total time spent 35 minutes, of which more than 50% was spent in coordination of care and counseling (time spent with patient face to face, physical exam, reviewing laboratory and imaging investigations, speaking with physicians and nursing staff involved in this patient's care)   Objective:      Current Facility-Administered Medications   Medication Dose Route Frequency   ??? atorvastatin (LIPITOR) tablet 40 mg  40 mg Oral QHS   ??? aspirin (ASPIRIN) tablet 325 mg  325 mg Oral DAILY   ??? sodium chloride (NS) flush 5-10 mL  5-10 mL IntraVENous Q8H   ??? sodium chloride (NS) flush 5-10 mL  5-10 mL IntraVENous PRN   ??? oxyCODONE-acetaminophen (PERCOCET) 5-325 mg per tablet 1 Tab  1 Tab Oral Q4H PRN   ??? naloxone (NARCAN) injection 0.1 mg  0.1 mg IntraVENous PRN   ??? insulin lispro (HUMALOG) injection   SubCUTAneous AC&HS   ??? nicotine (NICODERM CQ) 14 mg/24 hr patch 1 Patch  1 Patch TransDERmal Q24H        Patient Vitals for the past 8 hrs:   BP Temp Pulse Resp SpO2   03/16/15 1142 104/72 97.6 ??F (36.4 ??C) 68 17 98 %   03/16/15 0952 123/82 98.1 ??F (36.7 ??C) 64 18 97 %   03/16/15 0745 97/73 97.8 ??F (36.6 ??C) 67 18 99 %       Visit Vitals   ??? BP 104/72   ??? Pulse 68   ??? Temp 97.6 ??F (36.4 ??C)   ??? Resp 17   ??? Ht 5\' 9"  (1.753 m)   ??? Wt 80.4 kg (177 lb 4 oz)   ??? SpO2 98%   ??? BMI 26.18 kg/m2       Lab Review   Recent Results (from the past 24 hour(s))   GLUCOSE, POC    Collection Time: 03/15/15  4:13 PM   Result Value Ref Range    Glucose (POC) 138 (H) 65 - 105 mg/dL   GLUCOSE, POC    Collection Time: 03/15/15  9:15 PM   Result Value Ref Range    Glucose (POC) 109 (H) 65 - 105 mg/dL   CBC WITH AUTOMATED DIFF    Collection Time: 03/16/15  4:48 AM   Result Value Ref Range    WBC 4.4 4.0 - 11.0 1000/mm3    RBC 5.04 3.80 - 5.70 M/uL    HGB 15.3 12.4 - 17.2 gm/dl    HCT 16.1 09.6 - 04.5 %    MCV 88.9 80.0 - 98.0 fL    MCH 30.4 23.0 - 34.6 pg    MCHC 34.2 30.0 - 36.0 gm/dl    PLATELET 409 811 - 914 1000/mm3    MPV 8.3 6.0 - 10.0 fL    RDW-SD 45.1 (H) 35.1 - 43.9      NRBC 0 0 - 0      IMMATURE GRANULOCYTES 0.0 0.0 - 3.0 %    NEUTROPHILS 43.9 34 - 64 %    LYMPHOCYTES 45.4 28 - 48 %    MONOCYTES 8.6 1 - 13 %    EOSINOPHILS 1.4 0 - 5 %    BASOPHILS 0.7 0 - 3 %   METABOLIC PANEL, BASIC    Collection Time: 03/16/15  4:48 AM    Result Value Ref Range  Sodium 143 136 - 145 mEq/L    Potassium 4.1 3.5 - 5.1 mEq/L    Chloride 106 98 - 107 mEq/L    CO2 29 21 - 32 mEq/L    Glucose 104 74 - 106 mg/dl    BUN 12 7 - 25 mg/dl    Creatinine 1.3 0.6 - 1.3 mg/dl    GFR est AA >30>60      GFR est non-AA 60      Calcium 8.9 8.5 - 10.1 mg/dl   GLUCOSE, POC    Collection Time: 03/16/15  7:48 AM   Result Value Ref Range    Glucose (POC) 102 65 - 105 mg/dL   GLUCOSE, POC    Collection Time: 03/16/15 11:45 AM   Result Value Ref Range    Glucose (POC) 103 65 - 105 mg/dL       Results from Hospital Encounter encounter on 03/14/15   MRI CERV SPINE WO CONT   Narrative MRI OF THE CERVICAL SPINE    INDICATION: myelopathy. Left hand numbness  COMPARISON: No relevant studies.    TECHNIQUE: Multiplanar, multisequence MRI of the cervical spine was performed  without contrast.    FINDINGS:     Images are degraded by patient motion.    Osseous Structures: No focal aggressive appearing osseous lesions. Multilevel  osteophytes and degenerative endplate edema most pronounced extending from C3  through C7.    Posterior fossa and Craniocervical Junction: Unremarkable.    Spinal Cord: Normal Signal.    Levels:    C2-C3: Mild left foraminal stenosis due to facet and uncovertebral joint  hypertrophy. No central canal stenosis.    C3-C4: Disc osteophyte complex without significant central canal stenosis. Left  uncovertebral joint hypertrophy resulting in mild to moderate left foraminal  stenosis.    C4-C5: Diffuse disc osteophyte complex which effaces anterior CSF signal and  results in mild central canal stenosis. Bilateral facet and uncovertebral joint  hypertrophy results in moderate left and mild to moderate right foraminal  stenosis.    C5-C6: Diffuse disc osteophyte complex with broad-based central component.  Moderate central canal stenosis. Mild to moderate left and mild right foraminal  stenosis.    C6-C7: Moderate left foraminal stenosis due to facet and uncovertebral  joint  hypertrophy.    C7-T1: No significant central canal or foraminal stenosis.         Impression IMPRESSION:    1.  Multilevel degenerative disc disease most pronounced at C5-C6 which  demonstrates moderate central canal stenosis.  2.  Mild central canal stenosis at C4-C5.  3.  Multilevel foraminal stenosis.  Normal spinal cord signal.        MRI BRAIN W WO CONT   Narrative MRI OF THE BRAIN    INDICATION: Left hand numbness.      COMPARISON: 03/14/2015    TECHNIQUE: Multiplanar, multisequence MRI of the brain was performed with and  without contrast.      FINDINGS:    There are multiple areas of restricted diffusion within the right frontal and  parietal lobes. There is associated linear enhancement within the right parietal  and right frontal lobes favored to represent A perfusion. Within the right  parietal lobe there is intrinsic T1 hyperintensity likely related to petechial  hemorrhage seen on the prior CT. There is no significant mass effect or midline  shift. There is mild cortical atrophy.  Periventricular and subcortical white  matter T2 prolongation is most compatible with chronic microvascular ischemic  changes.  The sella and craniocervical junction are normal.    There is mild mucosal thickening of the ethmoid air cells.    The major proximal flow voids appear patent.         Impression IMPRESSION:    1.  Multiple areas of restricted diffusion within the right frontal and parietal  lobes favored to represent infarcts. There is petechial hemorrhage associated  with the right parietal infarct. Associated enhancement is likely related to  luxury perfusion. Cerebritis is also within the differential diagnosis, but  considered less likely. Correlate with inflammatory markers and overall clinical  picture.  2.  Chronic microvascular ischemic changes.        MRA BRAIN WO CONT   Addendum Indication is left hand numbness.      Rafael Bihari, MD 03/16/2015 12:13 PM              Narrative MRA OF THE HEAD WITHOUT CONTRAST    INDICATION: stroke    COMPARISON: No relevant studies.    TECHNIQUE: Multiplanar, multisequence MRA of the head was performed without  contrast. 3-D reconstructed images were performed and reviewed.    FINDINGS:    There is generalized attenuation of intradural segments of the bilateral  vertebral arteries which may be due to hypoplasia, artifact, or slow flow.  Basilar artery appears diminished in caliber may be due to atherosclerotic  disease. There is fetal origin of the bilateral posterior communicating  arteries. No significant stenosis within the anterior circulation. There is no  focal hemodynamically significant stenosis within the anterior or posterior  circulation. The anterior and posterior communicating arteries appear patent.  There is no definitive aneurysm.         Impression IMPRESSION:     1.  No evidence of aneurysm.  2.  No focal hemodynamically significant stenosis within the anterior  circulation.  3.   Attenuation of the intradural vertebral arteries which may be due to  hypoplasia, artifacts, or slow flow. Additional attenuation of the basilar  artery which may be due to atherosclerotic disease or hypoplasia.        I personally reviewed the MRI films and agree with the interpretation  Assessment:       Plan:     Signed:  Maryjo Rochester, MD  03/16/2015  1:29 PM

## 2015-03-16 NOTE — Progress Notes (Signed)
Discharge home with all personal belongings, discharge instructions with care notes and information on transitional care clinic attach, medication lipitor provided. Left unit in no distress. No s/s of discomfort,  Transported to lobby by care partner

## 2015-03-16 NOTE — Progress Notes (Signed)
Problem: Mobility Impaired (Adult and Pediatric)  Goal: *Therapy Goal (Edit Goal, Insert Text)  1. Pt will be independent with bed mobility  2. Pt will be able to transfer with supervision  3. Pt will be able to ambulate a distance of 200 feet x 2 without AD with supervision  4. Pt will be independent with LE exercises x 10-15 reps each  5. Increase strength BLE by 1/2 to1 grade higher  6. Improve dynamic standing balance to good minus  7. Tolerate OOB to chair TID for 60-90 mins  8. Patient will demonstrate good activity tolerance during functional activities.   PHYSICAL THERAPY EVALUATION         Patient: Ryan Warner (59 y.o. male)  Room: 6732/6732     Date: 03/16/2015  Start Time:  1400  End Time:  1430     Primary Diagnosis: Left arm numbness  Precordial chest pain  Abnormal head CT  Brain lesion           Precautions: Falls.  Weight bearing precautions: FWB     Orders reviewed, chart reviewed, and initial evaluation completed on Ryan Warner.  Discussed with nursing.      ASSESSMENT :  Based on the objective data described below, the patient presents with   Decreased Strength effecting function  Decreased ADL/Functional Activities  Decreased Transfer Abilities  Decreased Ambulation Ability/Technique  Decreased Balance  Decreased Independence with Home Exercise Program.     - patient able to ambulate a distance of 130 feet with CGA x 1, one episode of LOB with l knee buckling.  - patient independent with mobility, but requires CGA x 1 with transfers.  - min VC's and cueing needed for correct technique and safety     Patient will benefit from skilled  Physical Therapy intervention to address the above impairments.     Patient???s rehabilitation potential is considered to be Good          PLAN :  Planned Interventions:  Functional mobility training Gait Training Balance Training Therapeutic exercises Therapeutic activities Patient/caregiver education      Frequency/Duration: Patient will be followed by physical therapy 3x / Week and 5x / Week to address goals.     Recommendations:  Physical Therapy  Discharge Recommendations: Home with home health PT and TBD, pending progress  Further Equipment Recommendations for Discharge: to be determined - none at this time           Please refer to Patient Education and Care Plan sections of chart for additional details      SUBJECTIVE:   Patient: Patient agrees to PT, denies pain       OBJECTIVE DATA SUMMARY:   Present illness history:   Problem List  Never Reviewed           Codes Class Noted     Precordial chest pain ICD-10-CM: R07.2  ICD-9-CM: 786.51   03/14/2015           Left arm numbness ICD-10-CM: R20.0  ICD-9-CM: 782.0   03/14/2015           Abnormal head CT ICD-10-CM: R93.0  ICD-9-CM: 793.0   03/14/2015           Brain lesion ICD-10-CM: G93.9  ICD-9-CM: 348.89   03/14/2015           Chronic knee pain ICD-10-CM: M25.569, G89.29  ICD-9-CM: 719.46, 338.29   08/10/2013           Leg length difference,  acquired ICD-10-CM: M21.70  ICD-9-CM: 736.81   08/10/2013           Personal history of poliomyelitis ICD-10-CM: Z86.12  ICD-9-CM: V12.02   08/10/2013           Flat foot ICD-10-CM: M21.40  ICD-9-CM: 734   08/10/2013                Past Medical history:   Past Medical History   Diagnosis Date   ??? Arthritis 2003   ??? Asbestos exposure     ??? Polio 1988         PRIOR LEVEL OF FUNCTION / LIVING SITUATION      Information was obtained by:   patient  Home environment:   Apartment, patient lives with a room mate who works full time in an apt, 1st floor without steps  Prior level of function:   Retired, unable to drive, but able to sit in car, independent with ADLs, Independent with ambulation and community ambulatory  Home equipment: None        Patient found: Bed and Telemetry.     Pain Assessment before PT session: None observed and None reported  Pain Location:    Pain Assessment after PT session: None observed and None reported   Pain Location:    [ ]           Yes, patient had pain medications  [ ]           No, Patient has not had pain medications  [ ]           Nurse notified      COGNITIVE STATUS:      Patient is alert, oriented x3 with no cognitive deficits      EXTREMITIES ASSESSMENT:       Tone & Sensation:   Normal     Proprioception:   Intact     Strength:    bilateral, lower extremity(s), grossly 3 plus to 4/5     Range Of Motion:  bilateral, lower extremity(s), grossly WFL, pt with left leg length discrepancy      Functional mobility and balance status:      Mobility:  Supine to sit -  modified independent  Sit to Supine -  modified independent  Sit to Stand -  standby assist and contact guard  Stand to Sit -  standby assist and contact guard     Transfers:  Transfer back to bed after ambulation with supervision      Balance:  Static Sitting Balance -  good  Dynamic Sitting Balance -  good  Static Standing Balance -  good-  Dynamic Standing Balance -  good- and fair+     Activity Tolerance: fair+.     Ambulation/Gait Training:  unsteady, step to, decreased cadence, decreased step height/length and narrow BOS, one episode of LOB with episode of L knee buckling, patient with Left leg length discrepancy without AD  contact guard  130 feet     Stair Training:  not tested        Activity Tolerance:   fair+     Final Location:   bed, all needs close, agrees to call for assistance and nurse notified       COMMUNICATION/EDUCATION:      Barriers to Learning/Limitations:  None  Education provided patient on  Patient, Benefit of activity while hospitalized, Call for assistance, Out of bed 2-3 times/day, Staff assistance with mobility, Changes positions frequently, Sit out of bed for 45-60  minutes or as tolerated, Safety, Functional mobility, Demonstrates adequately, Verbalized understanding, Teaching method, Verbal and Role of PT     Thank you for this referral.  Ryan Warner, PT  Pager # : [phone]

## 2015-03-16 NOTE — Other (Signed)
Bedside and Verbal shift change report given to cassandra (oncoming nurse) by clemence (offgoing nurse). Report included the following information SBAR.

## 2015-03-16 NOTE — Progress Notes (Signed)
Patient in bed no distress alert verbally responsive, speech clear and coherent, denies headache, dizziness or blurr vision, skin positive for sensation. Call [place] in reach

## 2015-03-16 NOTE — Progress Notes (Signed)
Per dr patel progress note recommend pt/ot.  Patient states does not need occupational therapy.  Dr Matthew Sarasnarola informed. Care manager consult,torb.  Care manager on unit informed

## 2015-03-16 NOTE — Progress Notes (Cosign Needed)
[facility]    Face to Face Encounter    Patient???s Name: Ryan Warner    Date of Birth: 05/09/1956    Primary Diagnosis: Left arm numbness  Precordial chest pain  Abnormal head CT  Brain lesion    Date of Face to Face:   03/16/2015                                  Face to Face Encounter findings are related to primary reason for home care:   yes.     1. I certify that the patient needs intermittent care as follows: skilled nursing care:  skilled observation/assessment, patient education  physical therapy: strengthening, stretching/ROM, transfer training, gait/stair training, balance training and pt/caregiver education    2. I certify that this patient is homebound, that is: 1) patient requires the use of a walker device, special transportation, or assistance of another to leave the home; or 2) patient's condition makes leaving the home medically contraindicated; and 3) patient has a normal inability to leave the home and leaving the home requires considerable and taxing effort.  Patient may leave the home for infrequent and short duration for medical reasons, and occasional absences for non-medical reasons. Homebound status is due to the following functional limitations: Patient with strength deficits limiting the performance of all ADL's without caregiver assistance or the use of an assistive device.  Patient with poor safety awareness and is at risk for falls without assistance of another person and the use of an assistive device.  Patient with poor ambulation endurance limiting their safe ability to ascend/descend the required number of steps to leave the home.    3. I certify that this patient is under my care and that I, or a nurse practitioner or physician???s assistant, or clinical nurse specialist, or certified nurse midwife, working with me, had a Face-to-Face Encounter that meets the physician Face-to-Face Encounter requirements.  The  following are the clinical findings from the Face-to-Face encounter that support the need for skilled services and is a summary of the encounter:     See discharge summary      Verdene LennertDebra Dixon, RN  03/16/2015      THE FOLLOWING TO BE COMPLETED BY THE COMMUNITY PHYSICIAN:    I concur with the findings described above from the F2F encounter that this patient is homebound and in need of a skilled service.    Certifying Physician: _____________________________________      Printed Certifying Physician Name: _____________________________________    Date: _________________

## 2015-03-16 NOTE — Progress Notes (Signed)
Daily Progress Note    Patient: Ryan Warner   Age:  59 y.o.  DOA: 03/14/2015   Admit Dx / CC: Left arm numbness  Precordial chest pain  Abnormal head CT  Brain lesion  LOS:  LOS: 2 days       Anticipated Date of Discharge: tomrorow depends on MRI results   Anticipated Disposition (home, SNF) : home     Assessment/ Plan:      Assessment/Plan:     IMPRESSION:       Patient Active Problem List   Diagnosis Code   ??? Chronic knee pain M25.569, G89.29   ??? 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 Flat foot M21.40   ??? Precordial chest pain R07.2   ??? Left arm numbness R20.0   ??? Abnormal head CT R93.0   ??? Brain lesion G93.9      PLAN:     **left arm numbnessand trouble coordination  - no focal weakness  - CT head- . Focal right parietal lesion which could indicate metastasis, focal  cerebritis, or primary neoplasm versus unusual infarct.. Central zone of  petechial hemorrhage not excluded. Correlation with contrast MRI may be helpful  for further evaluation. ??  - CT head- . Focal right parietal lesion which could indicate metastasis, focal  cerebritis, or primary neoplasm versus unusual infarct.. Central zone of  petechial hemorrhage not excluded. Correlation with contrast MRI may be helpful  for further evaluation. ??  - MRI brain- Multiple areas of restricted diffusion within the right frontal and parietal lobes favored to represent infarcts. There is petechial hemorrhage associated with the right parietal infarct. Associated enhancement is likely related to luxury perfusion. Cerebritis is also within the differential diagnosis, but considered less likely. Correlate with inflammatory markers and overall clinical Picture.  - ESR and CRP pending  - MRA brain and neck- Attenuation of the intradural vertebral arteries which may be due to  hypoplasia, artifacts, or slow flow. Additional attenuation of the basilar  artery which may be due to atherosclerotic disease or hypoplasia.   - MRI C spine- Multilevel degenerative disc disease most pronounced at C5-C6 which  demonstrates moderate central canal stenosis  - Carotid dopler- < 50% right internal carotid artery stenosis.  < 50% left internal carotid artery stenosis.  - Echo- The estimated left ventricular ejection fraction is 70%. Based on the transmitral spectral Doppler flow pattern the diastolic function is normal for age. There is mild concentric left ventricular hypertrophy  - pt will be seen by Dr. Posey Pronto neuro- if okay with him will d/c home with home PT  - ESR and CRP pending   - no obvious surrounding edema so will hold off on steroid   ??      Subjective:  Subjective:    Pt seen and examined. Trouble coordinating with left hand and having on-off numbness. No fever     Objective:     Patient Vitals for the past 24 hrs:   Temp Pulse Resp BP SpO2   03/16/15 1142 97.6 ??F (36.4 ??C) 68 17 104/72 98 %   03/16/15 0952 98.1 ??F (36.7 ??C) 64 18 123/82 97 %   03/16/15 0745 97.8 ??F (36.6 ??C) 67 18 97/73 99 %   03/16/15 0323 98 ??F (36.7 ??C) 69 16 101/73 97 %   03/15/15 2321 97.7 ??F (36.5 ??C) (!) 55 16 104/56 100 %   03/15/15 2048 98.2 ??F (36.8 ??C)  61 16 114/74 98 %   03/15/15 1532 97.9 ??F (36.6 ??C) 64 18 92/73 98 %       Intake and Output:  Current Shift:  10/22 0701 - 10/22 1900  In: 480 [P.O.:480]  Out: 300 [Urine:300]  Last three shifts:  10/20 1901 - 10/22 0700  In: 1080 [P.O.:1080]  Out: -     Lab/Data Review:    Recent Labs      03/16/15   0448  03/14/15   1526  03/14/15   1342   WBC  4.4   --   5.5   HGB  15.3  17.3*  16.1   HCT  44.8  51  47.0   PLT  237   --   237     Recent Labs      03/16/15   0448  03/15/15   0257  03/14/15   1526   NA  143  142  140   K  4.1  3.3*  3.7   CL  106  102  98   CO2  _0 GLU  104  87  91   BUN  _1 CREA  1.3  1.1  1.3   CA  8.9  8.4*   --      No results for input(s): PH, PCO2, PO2, HCO3, FIO2 in the last 72 hours.    Medications:     Current Facility-Administered Medications    Medication Dose Route Frequency   ??? atorvastatin (LIPITOR) tablet 40 mg  40 mg Oral QHS   ??? aspirin (ASPIRIN) tablet 325 mg  325 mg Oral DAILY   ??? sodium chloride (NS) flush 5-10 mL  5-10 mL IntraVENous Q8H   ??? sodium chloride (NS) flush 5-10 mL  5-10 mL IntraVENous PRN   ??? oxyCODONE-acetaminophen (PERCOCET) 5-325 mg per tablet 1 Tab  1 Tab Oral Q4H PRN   ??? naloxone (NARCAN) injection 0.1 mg  0.1 mg IntraVENous PRN   ??? insulin lispro (HUMALOG) injection   SubCUTAneous AC&HS   ??? nicotine (NICODERM CQ) 14 mg/24 hr patch 1 Patch  1 Patch TransDERmal Q24H       Echo-The estimated left ventricular ejection fraction is 70%.  Based on the transmitral spectral Doppler flow pattern the diastolic function  is normal for age.  There is mild concentric left ventricular hypertrophy.  No significant regurgitation  Based on the peak tricuspid regurgitation velocity the estimated right  ventricular systolic pressure is 18 mmHg.  Injection of Saline Contrast was negative for interatrial shunting.  No previous for comparison    Physical Exam:  General appearance: Alert, cooperative, no distress, appears stated age  Head: Normocephalic, without obvious abnormality, atraumatic  Neck: Supple, no lymphadenopathy or thyromegaly, trachea midline  Lungs: Clear to auscultation bilaterally  Heart: Regular rate and rhythm, S1, S2 normal, no murmur, click, rub or gallop  Abdomen: Soft, non-tender and not-distended. Bowel sounds normal. No masses, no organomegaly  Extremities: Extremities normal, atraumatic, no cyanosis or edema  Skin: Skin color, texture, turgor normal. No rashes or lesions  Neurologic: left arm numbness and trouble coordination    All labs and radiographic findings reviewed.  All meds reviewed.    I have spent 25 minutes examining the patient, obtaining history, reviewing lab values, medications, radiographic data, vital signs. Over half this time has been direct patient contact.     Camille Bal, MD  03/16/2015  [facility]

## 2015-03-16 NOTE — Progress Notes (Signed)
[facility]

## 2015-03-16 NOTE — Discharge Summary (Signed)
Discharge Summary    Patient: Ryan Warner               Sex: male          DOA: 03/14/2015         Date of Birth:  14-Dec-1955      Age:  59 y.o.        LOS:  LOS: 2 days                Admit Date: 03/14/2015    Discharge Date: 03/16/2015    Primary Care Physician: None    Discharge Diagnoses:    Problem List as of 03/16/2015  Never Reviewed          Codes Class Noted - Resolved    Precordial chest pain ICD-10-CM: R07.2  ICD-9-CM: 786.51  03/14/2015 - Present        Left arm numbness ICD-10-CM: R20.0  ICD-9-CM: 782.0  03/14/2015 - Present        Abnormal head CT ICD-10-CM: R93.0  ICD-9-CM: 793.0  03/14/2015 - Present        Brain lesion ICD-10-CM: G93.9  ICD-9-CM: 348.89  03/14/2015 - Present        Chronic knee pain ICD-10-CM: M25.569, G89.29  ICD-9-CM: 719.46, 338.29  08/10/2013 - Present        Leg length difference, acquired ICD-10-CM: M21.70  ICD-9-CM: 736.81  08/10/2013 - Present        Personal history of poliomyelitis ICD-10-CM: Z86.12  ICD-9-CM: V12.02  08/10/2013 - Present        Flat foot ICD-10-CM: M21.40  ICD-9-CM: 161  08/10/2013 - Present              Discharge Medications:     Current Discharge Medication List      START taking these medications    Details   atorvastatin (LIPITOR) 40 mg tablet Take 1 Tab by mouth nightly.  Qty: 30 Tab, Refills: 0         CONTINUE these medications which have NOT CHANGED    Details   aspirin (ASPIRIN) 325 mg tablet Take 325 mg by mouth daily.             Follow-up: Dr. Harriette Ohara or Dr. Posey Pronto neuro in 1 week  PCP in 1 week     Discharge Condition: Good    Discharge instruction:  Patient instructed to return to the emergency department if: Chest pain, shortness of breath, altered mental status, fever, chills, nausea, vomiting, abdominal pain or any other concerns.    Activity: Activity as tolerated    Diet: Cardiac Diet    Wound Care: None needed    Labs:  Labs: Results:       Chemistry Recent Labs      03/16/15   0448  03/15/15   0257  03/14/15   1526   GLU  104  87  91    NA  143  142  140   K  4.1  3.3*  3.7   CL  106  102  98   CO2  '29  31  26   ' BUN  '12  11  11   ' CREA  1.3  1.1  1.3   CA  8.9  8.4*   --       CBC w/Diff Recent Labs      03/16/15   0448  03/14/15   1526  03/14/15   1342   WBC  4.4   --  5.5   RBC  5.04   --   5.22   HGB  15.3  17.3*  16.1   HCT  44.8  51  47.0   PLT  237   --   237   GRANS  43.9   --   47.3   LYMPH  45.4   --   43.8   EOS  1.4   --   1.5      Cardiac Enzymes No results for input(s): CPK, CKND1, MYO in the last 72 hours.    No lab exists for component: CKRMB, TROIP   Coagulation No results for input(s): PTP, INR, APTT in the last 72 hours.    No lab exists for component: INREXT    Lipid Panel Lab Results   Component Value Date/Time    CHOLESTEROL, TOTAL 206 03/15/2015 02:57 AM    HDL CHOLESTEROL 40 03/15/2015 02:57 AM    LDL, CALCULATED 128 03/15/2015 02:57 AM    TRIGLYCERIDE 192 03/15/2015 02:57 AM    CHOL/HDL RATIO 5.2 03/15/2015 02:57 AM      BNP No results for input(s): BNPP in the last 72 hours.   Liver Enzymes No results for input(s): TP, ALB, TBIL, AP, SGOT, GPT in the last 72 hours.    No lab exists for component: DBIL   Thyroid Studies Lab Results   Component Value Date/Time    TSH 2.600 03/15/2015 02:57 AM          Imaging:    - CT head- . Focal right parietal lesion which could indicate metastasis, focal  cerebritis, or primary neoplasm versus unusual infarct.. Central zone of  petechial hemorrhage not excluded. Correlation with contrast MRI may be helpful  for further evaluation. ??  - MRI brain- Multiple areas of restricted diffusion within the right frontal and parietal lobes favored to represent infarcts. There is petechial hemorrhage associated with the right parietal infarct. Associated enhancement is likely related to luxury perfusion. Cerebritis is also within the differential diagnosis, but considered less likely. Correlate with inflammatory markers and overall clinical Picture.  - ESR and CRP pending   - MRA brain and neck- Attenuation of the intradural vertebral arteries which may be due to  hypoplasia, artifacts, or slow flow. Additional attenuation of the basilar  artery which may be due to atherosclerotic disease or hypoplasia.  - MRI C spine- Multilevel degenerative disc disease most pronounced at C5-C6 which  demonstrates moderate central canal stenosis  - Carotid dopler- < 50% right internal carotid artery stenosis.  < 50% left internal carotid artery stenosis.  - Echo- The estimated left ventricular ejection fraction is 70%. Based on the transmitral spectral Doppler flow pattern the diastolic function is normal for age. There is mild concentric left ventricular hypertrophy      Consults: Neurology    Treatment Team: Treatment Team: Attending Provider: Camille Bal, MD; Consulting Provider: Delora Fuel, MD; Consulting Provider: Camille Bal, MD; Consulting Provider: Clifton Custard, MD; Physical Therapist: Rollene Fare. Ramiro, PT; Occupational Therapist: Elio Forget, OT    Significant Diagnostic Studies:     Hospital Course: Ryan Warner is a 59  year old man with a medical history significant for Arthritis , who is being admitted for progressively worsening left arm numbness and difficulty muscle coordination (pt states that he is having difficulty holding cigarrett by left hand) intially started about a week ago and got worse to the point made him come to ER. In ER CT head  was done showed . Focal right parietal lesion which could indicate metastasis, focal cerebritis, or primary neoplasm versus unusual infarct.. Central zone of petechial hemorrhage not excluded. Correlation with contrast MRI may be helpful for further evaluation. Neurologist Dr. Harriette Ohara was called from ER and recommended MRI brain and frequent neuro checks.     MRI brain showed- Multiple areas of restricted diffusion within the right frontal and parietal lobes favored to represent infarcts. There is  petechial hemorrhage associated with the right parietal infarct. Associated enhancement is likely related to luxury perfusion. Cerebritis is also within the differential diagnosis, but considered less likely. Correlate with inflammatory markers and overall clinical Picture. Pt is non complaint with ASA. Started on ASA and statin. He will be seen by Dr. Posey Pronto neurologist today and if its okay with him, he will be dc home with home PT. MRA head, neck, carotid dopler and  echo reviewed. He is stable to be d/c hoem if cleared by neuro     Detail hospital course stated below    **left arm numbnessand trouble coordination  - no focal weakness  - CT head- . Focal right parietal lesion which could indicate metastasis, focal  cerebritis, or primary neoplasm versus unusual infarct.. Central zone of  petechial hemorrhage not excluded. Correlation with contrast MRI may be helpful  for further evaluation. ??  - MRI brain- Multiple areas of restricted diffusion within the right frontal and parietal lobes favored to represent infarcts. There is petechial hemorrhage associated with the right parietal infarct. Associated enhancement is likely related to luxury perfusion. Cerebritis is also within the differential diagnosis, but considered less likely. Correlate with inflammatory markers and overall clinical Picture.  - ESR and CRP pending  - MRA brain and neck- Attenuation of the intradural vertebral arteries which may be due to  hypoplasia, artifacts, or slow flow. Additional attenuation of the basilar  artery which may be due to atherosclerotic disease or hypoplasia.  - MRI C spine- Multilevel degenerative disc disease most pronounced at C5-C6 which  demonstrates moderate central canal stenosis  - Carotid dopler- < 50% right internal carotid artery stenosis.  < 50% left internal carotid artery stenosis.  - Echo- The estimated left ventricular ejection fraction is 70%. Based on  the transmitral spectral Doppler flow pattern the diastolic function is normal for age. There is mild concentric left ventricular hypertrophy  ??- out pt f/u with Dr. Harriette Ohara??  ??    Visit Vitals   ??? BP 104/72   ??? Pulse 68   ??? Temp 97.6 ??F (36.4 ??C)   ??? Resp 17   ??? Ht '5\' 9"'  (1.753 m)   ??? Wt 80.4 kg (177 lb 4 oz)   ??? SpO2 98%   ??? BMI 26.18 kg/m2       Discharge Physical Exam:  General appearance: Alert, cooperative, no distress, appears stated age  Head: Normocephalic, without obvious abnormality, atraumatic  Neck: Supple, no lymphadenopathy or thyromegaly, trachea midline  Lungs: Clear to auscultation bilaterally  Heart: Regular rate and rhythm, S1, S2 normal, no murmur, click, rub or gallop  Abdomen: Soft, non-tender and not-distended. Bowel sounds normal. No masses,  no organomegaly  Extremities: Extremities normal, atraumatic, no cyanosis or edema  Skin: Skin color, texture, turgor normal. No rashes or lesions  Neurologic: Grossly normal and non focal, normal muscle tone and power, cranial nerves are intact, normal sensation       Camille Bal, MD  March 16, 2015  [facility]  Pager: [phone]    Time Spent: 32 min

## 2015-03-16 NOTE — Progress Notes (Signed)
Discharge teachings reviewed with patient verbalized understanding, e signature obtain,  Care manager was notify  Patient verbalized not able to get lipitor until November,  Indigent meds provided. heplock removed no bleeding  Redness or swelling

## 2015-10-24 DIAGNOSIS — I639 Cerebral infarction, unspecified: Secondary | ICD-10-CM

## 2015-10-24 HISTORY — DX: Cerebral infarction, unspecified: I63.9

## 2016-06-12 ENCOUNTER — Encounter (HOSPITAL_COMMUNITY): Payer: Self-pay | Admitting: Emergency Medicine

## 2016-06-12 ENCOUNTER — Ambulatory Visit (HOSPITAL_COMMUNITY)
Admission: EM | Admit: 2016-06-12 | Discharge: 2016-06-12 | Disposition: A | Discharge status: Home / Self Care | Payer: BLUE CROSS/BLUE SHIELD | Attending: Family Medicine | Admitting: Family Medicine

## 2016-06-12 DIAGNOSIS — J029 Acute pharyngitis, unspecified: Secondary | ICD-10-CM | POA: Diagnosis not present

## 2016-06-12 HISTORY — DX: Cerebral infarction, unspecified: I63.9

## 2016-06-12 MED ORDER — IPRATROPIUM BROMIDE 0.06 % NA SOLN: 2.0000 | Freq: Four times a day (QID) | NASAL | 1 refills | Status: DC

## 2016-06-12 NOTE — ED Triage Notes (Signed)
Here for throat pain onset 4 months associated w/dyphagia and odynophagia  Does not have PCP... 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   Smoke 5 cigs per day  A&O x4... NAD

## 2016-06-12 NOTE — ED Provider Notes (Signed)
[facility]    CSN: [id number] Arrival date & time: 06/12/16  1039     History   Chief Complaint Chief Complaint  Patient presents with  . Sore Throat    HPI Leroy Cook is a 61 y.o. male.   The history is provided by the patient.  Sore Throat  This is a chronic problem. The current episode started more than 1 week ago (since cva 69mo ago/). The problem has been gradually worsening. The symptoms are aggravated by swallowing.    Past Medical History:  Diagnosis Date  . Stroke (cerebrum) ([place])     There are no active problems to display for this patient.   History reviewed. No pertinent surgical history.     Home Medications    Prior to Admission medications   Medication Sig Start Date End Date Taking? Authorizing Provider  aspirin EC 81 MG tablet Take 81 mg by mouth daily.    Historical Provider, MD  ipratropium (ATROVENT) 0.06 % nasal spray Place 2 sprays into both nostrils 4 (four) times daily. Patient taking differently: Place 2 sprays into both nostrils 4 (four) times daily as needed for rhinitis (allergies).  06/12/16   Billy Fischer, MD  meclizine (ANTIVERT) 12.5 MG tablet Take 1 tablet (12.5 mg total) by mouth 3 (three) times daily as needed for dizziness. 06/22/16   Margette Fast, MD  Multiple Vitamins-Minerals (MULTIVITAMIN WITH MINERALS) tablet Take 1 tablet by mouth daily.    Historical Provider, MD    Family History History reviewed. No pertinent family history.  Social History Social History  Substance Use Topics  . Smoking status: Current Some Day Smoker  . Smokeless tobacco: Never Used  . Alcohol use No     Allergies   Patient has no known allergies.   Review of Systems Review of Systems  Constitutional: Positive for appetite change. Negative for fever.  HENT: Positive for postnasal drip, sore throat and voice change.   All other systems reviewed and are negative.    Physical Exam Triage Vital Signs ED Triage Vitals  [06/12/16 1149]  Enc Vitals Group     BP 143/91     Pulse Rate 74     Resp 16     Temp 98.5 F (36.9 C)     Temp Source Oral     SpO2 97 %     Weight      Height      Head Circumference      Peak Flow      Pain Score      Pain Loc      Pain Edu?      Excl. in [place]?    No data found.   Updated Vital Signs BP 143/91 (BP Location: Right Arm)   Pulse 74   Temp 98.5 F (36.9 C) (Oral)   Resp 16   SpO2 97%   Visual Acuity Right Eye Distance:   Left Eye Distance:   Bilateral Distance:    Right Eye Near:   Left Eye Near:    Bilateral Near:     Physical Exam  Constitutional: He is oriented to person, place, and time. He appears well-developed and well-nourished. No distress.  HENT:  Right Ear: External ear normal.  Left Ear: External ear normal.  Mouth/Throat: Oropharynx is clear and moist.  Neck: Normal range of motion. Neck supple.  Lymphadenopathy:    He has no cervical adenopathy.  Neurological: He is alert and oriented to person, place,  and time. A cranial nerve deficit is present.  Nursing note and vitals reviewed.    UC Treatments / Results  Labs (all labs ordered are listed, but only abnormal results are displayed) Labs Reviewed - No data to display  EKG  EKG Interpretation None       Radiology No results found.  Procedures Procedures (including critical care time)  Medications Ordered in UC Medications - No data to display   Initial Impression / Assessment and Plan / UC Course  I have reviewed the triage vital signs and the nursing notes.  Pertinent labs & imaging results that were available during my care of the patient were reviewed by me and considered in my medical decision making (see chart for details).       Final Clinical Impressions(s) / UC Diagnoses   Final diagnoses:  Pharyngitis, unspecified etiology    New Prescriptions Discharge Medication List as of 06/12/2016 12:22 PM    START taking these medications   Details    ipratropium (ATROVENT) 0.06 % nasal spray Place 2 sprays into both nostrils 4 (four) times daily., Starting Fri 06/12/2016, Normal         Billy Fischer, MD 06/24/16 2105

## 2016-06-12 NOTE — Discharge Instructions (Signed)
Call ent doctor for further eval.

## 2016-06-22 ENCOUNTER — Emergency Department (HOSPITAL_COMMUNITY): Payer: BLUE CROSS/BLUE SHIELD

## 2016-06-22 ENCOUNTER — Emergency Department (HOSPITAL_COMMUNITY)
Admission: EM | Admit: 2016-06-22 | Discharge: 2016-06-22 | Disposition: A | Discharge status: Home / Self Care | Payer: BLUE CROSS/BLUE SHIELD | Attending: Emergency Medicine | Admitting: Emergency Medicine

## 2016-06-22 ENCOUNTER — Encounter (HOSPITAL_COMMUNITY): Payer: Self-pay | Admitting: Emergency Medicine

## 2016-06-22 DIAGNOSIS — F17200 Nicotine dependence, unspecified, uncomplicated: Secondary | ICD-10-CM | POA: Diagnosis not present

## 2016-06-22 DIAGNOSIS — R791 Abnormal coagulation profile: Secondary | ICD-10-CM | POA: Diagnosis not present

## 2016-06-22 DIAGNOSIS — F1410 Cocaine abuse, uncomplicated: Secondary | ICD-10-CM

## 2016-06-22 DIAGNOSIS — Z8673 Personal history of transient ischemic attack (TIA), and cerebral infarction without residual deficits: Secondary | ICD-10-CM | POA: Diagnosis not present

## 2016-06-22 DIAGNOSIS — Z79899 Other long term (current) drug therapy: Secondary | ICD-10-CM | POA: Insufficient documentation

## 2016-06-22 DIAGNOSIS — R42 Dizziness and giddiness: Secondary | ICD-10-CM | POA: Insufficient documentation

## 2016-06-22 LAB — COMPREHENSIVE METABOLIC PANEL
ALT: 11 U/L — AB (ref 17–63)
AST: 19 U/L (ref 15–41)
Albumin: 3.8 g/dL (ref 3.5–5.0)
Alkaline Phosphatase: 45 U/L (ref 38–126)
Anion gap: 6 (ref 5–15)
BUN: 20 mg/dL (ref 6–20)
CHLORIDE: 106 mmol/L (ref 101–111)
CO2: 28 mmol/L (ref 22–32)
CREATININE: 1 mg/dL (ref 0.61–1.24)
Calcium: 8.7 mg/dL — ABNORMAL LOW (ref 8.9–10.3)
GFR calc Af Amer: >60 mL/min (ref >60)
GFR calc non Af Amer: >60 mL/min (ref >60)
Glucose, Bld: 105 mg/dL — ABNORMAL HIGH (ref 65–99)
Potassium: 3.8 mmol/L (ref 3.5–5.1)
SODIUM: 140 mmol/L (ref 135–145)
Total Bilirubin: 0.5 mg/dL (ref 0.3–1.2)
Total Protein: 7.6 g/dL (ref 6.5–8.1)

## 2016-06-22 LAB — RAPID URINE DRUG SCREEN, HOSP PERFORMED
Amphetamines: NOT DETECTED
BARBITURATES: NOT DETECTED
Benzodiazepines: NOT DETECTED
Cocaine: POSITIVE — AB
Opiates: NOT DETECTED
TETRAHYDROCANNABINOL: POSITIVE — AB

## 2016-06-22 LAB — APTT: APTT: 31 s (ref 24–36)

## 2016-06-22 LAB — DIFFERENTIAL
BASOS ABS: 0 10*3/uL (ref 0.0–0.1)
BASOS PCT: 0 %
Eosinophils Absolute: 0.1 10*3/uL (ref 0.0–0.7)
Eosinophils Relative: 2 %
LYMPHS PCT: 38 %
Lymphs Abs: 2.1 10*3/uL (ref 0.7–4.0)
Monocytes Absolute: 0.5 10*3/uL (ref 0.1–1.0)
Monocytes Relative: 8 %
NEUTROS ABS: 2.9 10*3/uL (ref 1.7–7.7)
Neutrophils Relative %: 52 %

## 2016-06-22 LAB — PROTIME-INR
INR: 1.1
Prothrombin Time: 14.3 seconds (ref 11.4–15.2)

## 2016-06-22 LAB — URINALYSIS, ROUTINE W REFLEX MICROSCOPIC
Bilirubin Urine: NEGATIVE
GLUCOSE, UA: NEGATIVE mg/dL
Hgb urine dipstick: NEGATIVE
KETONES UR: NEGATIVE mg/dL
LEUKOCYTES UA: NEGATIVE
Nitrite: NEGATIVE
PROTEIN: NEGATIVE mg/dL
Specific Gravity, Urine: 1.024 (ref 1.005–1.030)
pH: 6 (ref 5.0–8.0)

## 2016-06-22 LAB — CBC
HEMATOCRIT: 43 % (ref 39.0–52.0)
Hemoglobin: 15 g/dL (ref 13.0–17.0)
MCH: 31.1 pg (ref 26.0–34.0)
MCHC: 34.9 g/dL (ref 30.0–36.0)
MCV: 89 fL (ref 78.0–100.0)
Platelets: 309 10*3/uL (ref 150–400)
RBC: 4.83 MIL/uL (ref 4.22–5.81)
RDW: 14.6 % (ref 11.5–15.5)
WBC: 5.6 10*3/uL (ref 4.0–10.5)

## 2016-06-22 LAB — ETHANOL

## 2016-06-22 LAB — TROPONIN I: Troponin I: <0.03 ng/mL (ref <0.03)

## 2016-06-22 MED ORDER — MECLIZINE HCL 12.5 MG PO TABS: 12.5000 mg | ORAL_TABLET | Freq: Three times a day (TID) | ORAL | 0 refills | Status: DC | PRN

## 2016-06-22 MED ORDER — LORAZEPAM 2 MG/ML IJ SOLN
1.0000 mg | Freq: Once | INTRAMUSCULAR | Status: AC
  Administered 2016-06-22: 1 mg via INTRAVENOUS
  Filled 2016-06-22: qty 1

## 2016-06-22 MED ORDER — MECLIZINE HCL 25 MG PO TABS
25.0000 mg | ORAL_TABLET | Freq: Once | ORAL | Status: AC
Start: 2016-06-22 — End: 2016-06-22
  Administered 2016-06-22: 25 mg via ORAL
  Filled 2016-06-22: qty 1

## 2016-06-22 NOTE — ED Notes (Signed)
ED Provider at bedside.EDP LONG 

## 2016-06-22 NOTE — Progress Notes (Signed)
RN Case Manager has set Patient up with [facility]. CSW signing off at this time. Please consult should new need(s) arise.    Lorrine Kin, MSW, LCSW [facility] ED/70M Clinical Social Worker [phone]

## 2016-06-22 NOTE — Discharge Instructions (Signed)
We believe your symptoms were caused by benign vertigo.  Please read through the included information and take any prescribed medication(s).  Follow up with your doctor as listed above.  If you develop any new or worsening symptoms that concern you, including but not limited to persistent dizziness/vertigo, numbness or weakness in your arms or legs, altered mental status, persistent vomiting, or fever greater than 101, please return immediately to the Emergency Department.  

## 2016-06-22 NOTE — Progress Notes (Addendum)
Left a message (voice and text) for [facility] for referral for HHPT/OT/SW and aide  Pt reports being at a half way house when had stroke prior to coming to [place] given list of doctors for home visitis, [facility] [place],[facility], health  Dept, [place] office pharmacies that deliver and 24 hr pharmacies, financial assistance resources in [place]

## 2016-06-22 NOTE — Progress Notes (Signed)
Able to get pt a f/u appt at [facility] sickle cell clinic for Thursday March 1 202018 at 0830 to establish care Pt also given a list of [place] providers in his present zip code and encourged urgent care follow as needed Pt and EDP had been made aware that pt needed pcp f/u and teachable person at his home to assist with home health care  Updated ED RN Caryl Pina and EDP Dr Laverta Baltimore who entered in home health orders Provided a [place] transportation resources CM reviewed in details medicare guidelines, Choices of home health [facility]) (length of stay in home, types of [facility] staff available, coverage, primary caregiver, up to 24 hrs before services may be started) and choices of Private duty nursing (PDN-coverage, length of stay in the home types of staff available). CM reviewed availability of [facility] SW to assist pcp to get pt to snf (if desired disposition) from the community level. CM provided pt with a list of [place] home health agencies and PDN.

## 2016-06-22 NOTE — ED Notes (Signed)
Bed: WA09 Expected date:  Expected time:  Means of arrival:  Comments: 61 yo M/ Dizziness

## 2016-06-22 NOTE — Progress Notes (Signed)
   06/22/16 0000  CM Assessment  Expected Discharge [place]  In-house Referral NA  Discharge Planning Services CM Consult  [facility]  Choice offered to / list presented to  Patient  Discharge Disposition Home w [place]   Live brother in law, and another male who pt states "I do not want to put that on him" Says male is dependable but "has his own life"  Pt moved from [place] to [place] on 06/23/16 scheduled to see ENT but has no pcp Confirms BC[employer] coverage.  Also states has no transportation Brother in Sports coach has a car but not accessible to pt  DME is not available  Pt choice and agrees to [facility] as home health  Denies issues walking HX stroke has difficulty  Denies any past Home health or rehab after stroke "about a year and a half ago I was in [facility].  " they said they find a lesion on my brain"

## 2016-06-22 NOTE — ED Triage Notes (Signed)
Brought in by EMS from home with c/o dizziness, onset yesterday.  Pt reported that dizziness "is getting worse".  Has hx of previous two strokes which affected his left side with weakness and also with speech disturbance.  Pt denies any new neurological symptom.

## 2016-06-22 NOTE — Progress Notes (Signed)
Pt voiced appreciation for services provided and appt obtain Pt states he will establish care Discussed why ENT not considered a pcp and EDP not a pcp Pt voiced understanding

## 2016-06-22 NOTE — ED Notes (Signed)
PT NOT IN ROOM- REPORT PT IS IN MRI

## 2016-06-22 NOTE — ED Provider Notes (Signed)
[facility] DEPT Provider Note   CSN: [id number] Arrival date & time: 06/22/16  [id number]  By signing my name below, I, Neta Mends, attest that this documentation has been prepared under the direction and in the presence of Ripley Fraise, MD . Electronically Signed: Neta Mends, ED Scribe. 06/22/2016. 4:00 AM.    History   Chief Complaint Chief Complaint  Patient presents with  . Dizziness     Dizziness  Quality:  Head spinning and room spinning Severity:  Moderate Onset quality:  Sudden Duration:  1 day Timing:  Intermittent Chronicity:  New Context: standing up   Relieved by:  Nothing Worsened by:  Nothing Ineffective treatments:  None tried Associated symptoms: no chest pain, no headaches, no syncope, no vision changes, no vomiting and no weakness   Risk factors: hx of stroke    HPI Comments:  Leroy Cook is a 61 y.o. male with PMHx of stroke who presents to the Emergency Department complaining of intermittent dizziness that began yesterday morning. Pt reports that at 0900 yesterday he woke up, became instantly dizzy, and fell out of the bed. When he tried to get up again he lost his balance and had to sit down. He states that this happened again PTA so he decided to come to the ED. Pt has an ENT appointment tomorrow due to a persistent non-productive cough. Pt states that he has mild left sided weakness due to a stroke and chronic dysarthria. No alleviating factors noted. Pt denies fever, numbness, vomiting, headache, visual changes, chest pain, abdominal pain, LOC, numbness.   Past Medical History:  Diagnosis Date  . Stroke (cerebrum) ([place])     There are no active problems to display for this patient.   History reviewed. No pertinent surgical history.     Home Medications    Prior to Admission medications   Medication Sig Start Date End Date Taking? Authorizing Provider  ipratropium (ATROVENT) 0.06 % nasal spray Place 2 sprays into both nostrils  4 (four) times daily. 06/12/16   Billy Fischer, MD    Family History History reviewed. No pertinent family history.  Social History Social History  Substance Use Topics  . Smoking status: Current Some Day Smoker  . Smokeless tobacco: Never Used  . Alcohol use No     Allergies   Patient has no known allergies.   Review of Systems Review of Systems  Constitutional: Negative for fever.  Respiratory: Positive for cough.   Cardiovascular: Negative for chest pain and syncope.  Gastrointestinal: Negative for abdominal pain and vomiting.  Neurological: Positive for dizziness. Negative for syncope, weakness and headaches.  All other systems reviewed and are negative.    Physical Exam Updated Vital Signs BP 121/79   Pulse 64   Temp 97.8 F (36.6 C) (Oral)   Resp 18   Ht 5' 9.5" (1.765 m)   Wt 151 lb (68.5 kg)   SpO2 96%   BMI 21.98 kg/m   Physical Exam CONSTITUTIONAL: Well developed/well nourished HEAD: Normocephalic/atraumatic EYES: EOMI/PERRL, horizontal nystagmus, no ptosis ENMT: Mucous membranes moist; bilateral TMs occluded with cerumen  NECK: supple no meningeal signs, no bruits CV: S1/S2 noted, no murmurs/rubs/gallops noted LUNGS: Lungs are clear to auscultation bilaterally, no apparent distress ABDOMEN: soft, nontender, no rebound or guarding GU:no cva tenderness NEURO:Awake/alert, face symmetric, no arm or leg drift is noted Equal 5/5 strength with shoulder abduction, elbow flex/extension Cranial nerves 3/4/5/6/11/30/08/11/12 tested and intact No past pointing Sensation to light touch intact in all  extremities 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    COORDINATION OF CARE:  3:57 AM Will order blood work and CT scan. Discussed  treatment plan with pt at bedside and pt agreed to plan.   Labs (all labs ordered are listed, but only abnormal results are displayed) Labs Reviewed  COMPREHENSIVE METABOLIC PANEL - Abnormal; Notable for the following:       Result Value   Glucose, Bld 105 (*)    Calcium 8.7 (*)    ALT 11 (*)    All other components within normal limits  RAPID URINE DRUG SCREEN, HOSP PERFORMED - Abnormal; Notable for the following:    Cocaine POSITIVE (*)    Tetrahydrocannabinol POSITIVE (*)    All other components within normal limits  ETHANOL  PROTIME-INR  APTT  CBC  DIFFERENTIAL  URINALYSIS, ROUTINE W REFLEX MICROSCOPIC  TROPONIN I    EKG  EKG Interpretation  Date/Time:  Monday June 22 2016 03:51:58 EST Ventricular Rate:  65 PR Interval:    QRS Duration: 92 QT Interval:  411 QTC Calculation: 428 R Axis:   42 Text Interpretation:  Sinus rhythm Abnormal R-wave progression, early transition Abnormal ekg Confirmed by Christy Gentles  MD, Azzam Mehra ([id number]) on 06/22/2016 4:02:38 AM       Radiology Ct Head Wo Contrast  Result Date: 06/22/2016 CLINICAL DATA:  Vertigo. Dizziness leading to fall to floor. Previous CVA. EXAM: CT HEAD WITHOUT CONTRAST TECHNIQUE: Contiguous axial images were obtained from the base of the skull through the vertex without intravenous contrast. COMPARISON:  None. FINDINGS: Brain: No evidence of acute infarction, hemorrhage, hydrocephalus, extra-axial collection or mass lesion/mass effect. Mild generalized atrophy and periventricular chronic small vessel ischemia. Encephalomalacia in the right parietal and posterior frontal lobes. Vascular: No hyperdense vessel or unexpected calcification. Skull: Normal. Negative for fracture or focal lesion. Sinuses/Orbits: Paranasal sinuses and mastoid air cells are clear. The visualized orbits are unremarkable. Other: None. IMPRESSION: 1.  No acute intracranial abnormality. 2. Mild generalized atrophy and chronic small vessel ischemia. Right  parietal and posterior frontal encephalomalacia. Electronically Signed   By: Jeb Levering M.D.   On: 06/22/2016 04:59    Procedures Procedures (including critical care time)  Medications Ordered in ED Medications  meclizine (ANTIVERT) tablet 25 mg (25 mg Oral Given 06/22/16 0531)  LORazepam (ATIVAN) injection 1 mg (1 mg Intravenous Given 06/22/16 0714)     Initial Impression / Assessment and Plan / ED Course  I have reviewed the triage vital signs and the nursing notes.  Pertinent labs  results that were available during my care of the patient were reviewed by me and considered in my medical decision making (see chart for details).     4:21 AM Pt stable Dizziness started yesterday tPA in stroke considered but not given due to: Onset over 3-4.5hours 7:31 AM CT head negative Pt still reports symptoms of vertigo Due to h/o stroke with persistent vertigo will proceed with MRI  Also - pt reports he would like to be placed in a facility due to his debilities Case management consulted Also - discussed cocaine abuse as he has +UDS Pt reports he only used this one time recently and does not use regularly, advised to STOP cocaine use  At signout to Dr Laverta [place], f/u on MRI as  well as case management consult   Final Clinical Impressions(s) / ED Diagnoses   Final diagnoses:  Vertigo  Cocaine abuse    New Prescriptions New Prescriptions   No medications on file  I personally performed the services described in this documentation, which was scribed in my presence. The recorded information has been reviewed and is accurate.        Ripley Fraise, MD 06/22/16 [phone]

## 2016-06-22 NOTE — ED Provider Notes (Signed)
Blood pressure 120/86, pulse 63, temperature 97.8 F (36.6 C), temperature source Oral, resp. rate 18, height 5' 9.5" (1.765 m), weight 151 lb (68.5 kg), SpO2 95 %.  Assuming care from Dr. Christy Gentles.  In short, Leroy Cook is a 61 y.o. male with a chief complaint of Dizziness .  Refer to the original H&P for additional details.  The current plan of care is to follow MRI and social work consultation.  08:10 AM MRI resulted with no acute infarct. Awaiting case manager/social work consultation. Will continue to follow.   11:30 AM Patient has been seen with a case Freight forwarder. They have arranged for home health. Discussed with the patient who is pleased with this option. He feels comfortable with plan to discharge. Plan to send home with prescription for meclizine and PCP referral.   Nanda Quinton, MD   Margette Fast, MD 06/22/16 [phone]

## 2016-06-23 DIAGNOSIS — H6123 Impacted cerumen, bilateral: Secondary | ICD-10-CM | POA: Insufficient documentation

## 2016-06-23 DIAGNOSIS — R490 Dysphonia: Secondary | ICD-10-CM | POA: Insufficient documentation

## 2016-06-23 DIAGNOSIS — R42 Dizziness and giddiness: Secondary | ICD-10-CM | POA: Insufficient documentation

## 2016-07-23 ENCOUNTER — Encounter: Payer: Self-pay | Admitting: Internal Medicine

## 2016-07-23 ENCOUNTER — Encounter: Payer: Self-pay | Admitting: Family Medicine

## 2016-07-23 ENCOUNTER — Ambulatory Visit (INDEPENDENT_AMBULATORY_CARE_PROVIDER_SITE_OTHER): Payer: BLUE CROSS/BLUE SHIELD | Admitting: Family Medicine

## 2016-07-23 VITALS — BP 126/84 | HR 77 | Temp 98.2°F | Resp 16 | Ht 69.5 in | Wt 166.0 lb

## 2016-07-23 DIAGNOSIS — F17200 Nicotine dependence, unspecified, uncomplicated: Secondary | ICD-10-CM | POA: Diagnosis not present

## 2016-07-23 DIAGNOSIS — Z125 Encounter for screening for malignant neoplasm of prostate: Secondary | ICD-10-CM

## 2016-07-23 DIAGNOSIS — Z1211 Encounter for screening for malignant neoplasm of colon: Secondary | ICD-10-CM

## 2016-07-23 DIAGNOSIS — Z0000 Encounter for general adult medical examination without abnormal findings: Secondary | ICD-10-CM

## 2016-07-23 DIAGNOSIS — Z23 Encounter for immunization: Secondary | ICD-10-CM | POA: Diagnosis not present

## 2016-07-23 DIAGNOSIS — I6930 Unspecified sequelae of cerebral infarction: Secondary | ICD-10-CM | POA: Diagnosis not present

## 2016-07-23 LAB — COMPLETE METABOLIC PANEL WITH GFR
ALBUMIN: 4.2 g/dL (ref 3.6–5.1)
ALK PHOS: 56 U/L (ref 40–115)
ALT: 28 U/L (ref 9–46)
AST: 27 U/L (ref 10–35)
BILIRUBIN TOTAL: 0.9 mg/dL (ref 0.2–1.2)
BUN: 12 mg/dL (ref 7–25)
CALCIUM: 9 mg/dL (ref 8.6–10.3)
CO2: 24 mmol/L (ref 20–31)
CREATININE: 0.87 mg/dL (ref 0.70–1.25)
Chloride: 102 mmol/L (ref 98–110)
Glucose, Bld: 94 mg/dL (ref 65–99)
Potassium: 4.2 mmol/L (ref 3.5–5.3)
Sodium: 138 mmol/L (ref 135–146)
TOTAL PROTEIN: 7.5 g/dL (ref 6.1–8.1)

## 2016-07-23 LAB — CBC WITH DIFFERENTIAL/PLATELET
BASOS ABS: 0 {cells}/uL (ref 0–200)
BASOS PCT: 0 %
EOS ABS: 0 {cells}/uL — AB (ref 15–500)
EOS PCT: 0 %
HCT: 46.4 % (ref 38.5–50.0)
Hemoglobin: 15.6 g/dL (ref 13.2–17.1)
LYMPHS PCT: 32 %
Lymphs Abs: 1440 cells/uL (ref 850–3900)
MCH: 30.4 pg (ref 27.0–33.0)
MCHC: 33.6 g/dL (ref 32.0–36.0)
MCV: 90.3 fL (ref 80.0–100.0)
MONOS PCT: 8 %
MPV: 8.9 fL (ref 7.5–12.5)
Monocytes Absolute: 360 cells/uL (ref 200–950)
NEUTROS ABS: 2700 {cells}/uL (ref 1500–7800)
Neutrophils Relative %: 60 %
PLATELETS: 312 10*3/uL (ref 140–400)
RBC: 5.14 MIL/uL (ref 4.20–5.80)
RDW: 14.9 % (ref 11.0–15.0)
WBC: 4.5 10*3/uL (ref 3.8–10.8)

## 2016-07-23 LAB — POCT URINALYSIS DIP (DEVICE)
Glucose, UA: NEGATIVE mg/dL
Hgb urine dipstick: NEGATIVE
Ketones, ur: 15 mg/dL — AB
Leukocytes, UA: NEGATIVE
NITRITE: NEGATIVE
PH: 6 (ref 5.0–8.0)
PROTEIN: 100 mg/dL — AB
Specific Gravity, Urine: 1.025 (ref 1.005–1.030)
Urobilinogen, UA: 2 mg/dL — ABNORMAL HIGH (ref 0.0–1.0)

## 2016-07-23 LAB — PSA: PSA: 2.2 ng/mL (ref <=4.0)

## 2016-07-23 LAB — LIPID PANEL
CHOLESTEROL: 176 mg/dL (ref <200)
HDL: 40 mg/dL — AB (ref >40)
LDL Cholesterol: 109 mg/dL — ABNORMAL HIGH (ref <100)
Total CHOL/HDL Ratio: 4.4 Ratio (ref <5.0)
Triglycerides: 135 mg/dL (ref <150)
VLDL: 27 mg/dL (ref <30)

## 2016-07-23 LAB — POCT GLYCOSYLATED HEMOGLOBIN (HGB A1C): HEMOGLOBIN A1C: 5.5

## 2016-07-23 NOTE — Progress Notes (Signed)
Subjective:    Patient ID: Leroy Cook, male    DOB: Feb 28, 1956, 61 y.o.   MRN: [id number]  HPI Mr. Leroy Cook, a 61 year old male with a history of a left side CVA presents to establish care. He states that he was a patient at  [facility] in [place] prior to relocating to [place], [place] to reside with family. Patient is here for a routine physical exam. He says that he has been lost to follow up over the past several years. He has not had a prostate exam, dental exam, eye examination, or colonoscopy. He is not up to date with immunizations. He is currently not sexually active. He denies headache, shortness of breath, chest pain, dysuria, nausea, vomiting, or diarrhea. He is a current tobacco smoker with a 30 year smoking history.He states that he has decreased smoking to 3-4 cigarettes per day.  Past Medical History:  Diagnosis Date  . Stroke (cerebrum) ([place])    Immunization History  Administered Date(s) Administered  . Pneumococcal Conjugate-13 07/23/2016  . Tdap 07/23/2016   Social History   Social History  . Marital status: Widowed    Spouse name: N/A  . Number of children: N/A  . Years of education: N/A   Occupational History  . Not on file.   Social History Main Topics  . Smoking status: Current Some Day Smoker  . Smokeless tobacco: Never Used     Comment: hasnt smoked in 1 week (07/23/2016)   . Alcohol use No  . Drug use: Yes    Types: Marijuana     Comment: occ.   Marland Kitchen Sexual activity: Not on file   Other Topics Concern  . Not on file   Social History Narrative  . No narrative on file   Depression screen [facility] 2/9 07/23/2016 07/23/2016  Decreased Interest 1 0  Down, Depressed, Hopeless 1 0  PHQ - 2 Score 2 0  Altered sleeping 1 -  Tired, decreased energy 0 -  Change in appetite 0 -  Feeling bad or failure about yourself  0 -  Trouble concentrating 0 -  Moving slowly or fidgety/restless 0 -  Suicidal thoughts 0 -  PHQ-9 Score 3 -    Review of Systems  Constitutional: Negative for fatigue and fever.  HENT: Negative.  Negative for congestion and dental problem.   Eyes: Negative.  Negative for photophobia and visual disturbance.  Respiratory: Negative.   Cardiovascular: Negative.   Gastrointestinal: Negative.   Endocrine: Negative.   Genitourinary: Negative.   Musculoskeletal: Negative.   Skin: Negative.   Allergic/Immunologic: Negative for food allergies and immunocompromised state.  Neurological: Positive for weakness (Left side weaknesss).  Hematological: Negative.   Psychiatric/Behavioral: Negative.        Objective:   Physical Exam  Constitutional: He is oriented to person, place, and time. He appears well-developed and well-nourished.  HENT:  Head: Normocephalic and atraumatic.  Right Ear: External ear normal.  Left Ear: External ear normal.  Nose: Nose normal.  Mouth/Throat: Oropharynx is clear and moist.  Eyes: Conjunctivae are normal. Pupils are equal, round, and reactive to light.  Neck: Normal range of motion. Neck supple.  Cardiovascular: Normal rate, regular rhythm, normal heart sounds and intact distal pulses.   Pulmonary/Chest: Effort normal and breath sounds normal.  Abdominal: Soft. Bowel sounds are normal.  Genitourinary: Rectum normal and prostate normal. Prostate is not enlarged and not tender.  Musculoskeletal:       Left elbow: He exhibits  decreased range of motion (3/5 weakness to left upper extrremity).  Neurological: He is alert and oriented to person, place, and time. He has normal reflexes. He displays normal reflexes. No cranial nerve deficit. He exhibits normal muscle tone. Coordination normal.  Skin: Skin is warm and dry.      BP 126/84 (BP Location: Left Arm, Patient Position: Sitting, Cuff Size: Normal)   Pulse 77   Temp 98.2 F (36.8 C) (Oral)   Resp 16   Ht 5' 9.5" (1.765 m)   Wt 166 lb (75.3 kg)   SpO2 98%   BMI 24.16 kg/m  Assessment & Plan:  1. Routine  physical examination - EKG 12-Lead - HgB A1c - COMPLETE METABOLIC PANEL WITH GFR - CBC with Differential - HIV antibody (with reflex) - Hepatitis C antibody, reflex  2. Screening for colon cancer  - Ambulatory referral to Gastroenterology  3. Prostate cancer screening  - PSA  4. History of CVA with residual deficit - Lipid Panel  5. Need for Tdap vaccination - Tdap vaccine greater than or equal to 7yo IM  6. Immunization due - Pneumococcal conjugate vaccine 13-valent  7. Tobacco dependence Smoking cessation instruction/counseling given:  counseled patient on the dangers of tobacco use, advised patient to stop smoking, and reviewed strategies to maximize success    RTC: 3 months for chronic conditions   Miosha Behe M, FNP

## 2016-07-23 NOTE — Patient Instructions (Addendum)
Sent a referral to gastroenterology for colonoscopy. Received Tdap and pneumonia vaccinations today Will send a referral for an eye exam Will call with laboratory results.  Continue Aspirin 81 mg

## 2016-07-24 DIAGNOSIS — I6930 Unspecified sequelae of cerebral infarction: Secondary | ICD-10-CM | POA: Insufficient documentation

## 2016-07-24 DIAGNOSIS — Z1211 Encounter for screening for malignant neoplasm of colon: Secondary | ICD-10-CM | POA: Insufficient documentation

## 2016-07-24 DIAGNOSIS — Z0000 Encounter for general adult medical examination without abnormal findings: Secondary | ICD-10-CM | POA: Insufficient documentation

## 2016-07-24 LAB — HIV ANTIBODY (ROUTINE TESTING W REFLEX): HIV: NONREACTIVE

## 2016-07-24 LAB — HEPATITIS C ANTIBODY: HCV Ab: NEGATIVE

## 2016-09-16 ENCOUNTER — Encounter: Payer: Self-pay | Admitting: Internal Medicine

## 2016-09-16 ENCOUNTER — Ambulatory Visit (AMBULATORY_SURGERY_CENTER): Payer: Self-pay | Admitting: *Deleted

## 2016-09-16 VITALS — Ht 69.5 in | Wt 168.0 lb

## 2016-09-16 DIAGNOSIS — Z1211 Encounter for screening for malignant neoplasm of colon: Secondary | ICD-10-CM

## 2016-09-16 NOTE — Progress Notes (Signed)
Patient denies any allergies to eggs or soy. Patient denies any problems with anesthesia/sedation. Patient denies any oxygen use at home and does not take any diet/weight loss medications. NO email per pt.

## 2016-09-23 ENCOUNTER — Ambulatory Visit (AMBULATORY_SURGERY_CENTER): Payer: BLUE CROSS/BLUE SHIELD | Admitting: Internal Medicine

## 2016-09-23 ENCOUNTER — Encounter: Payer: Self-pay | Admitting: Internal Medicine

## 2016-09-23 VITALS — BP 120/88 | HR 78 | Temp 98.2°F | Resp 14 | Ht 69.0 in | Wt 168.0 lb

## 2016-09-23 DIAGNOSIS — D129 Benign neoplasm of anus and anal canal: Secondary | ICD-10-CM

## 2016-09-23 DIAGNOSIS — Z1211 Encounter for screening for malignant neoplasm of colon: Secondary | ICD-10-CM

## 2016-09-23 DIAGNOSIS — K621 Rectal polyp: Secondary | ICD-10-CM | POA: Diagnosis not present

## 2016-09-23 DIAGNOSIS — D128 Benign neoplasm of rectum: Secondary | ICD-10-CM

## 2016-09-23 DIAGNOSIS — D229 Melanocytic nevi, unspecified: Secondary | ICD-10-CM | POA: Insufficient documentation

## 2016-09-23 DIAGNOSIS — Z1212 Encounter for screening for malignant neoplasm of rectum: Secondary | ICD-10-CM | POA: Diagnosis not present

## 2016-09-23 MED ORDER — SODIUM CHLORIDE 0.9 % IV SOLN: 500.0000 mL | INTRAVENOUS | Status: AC

## 2016-09-23 NOTE — Patient Instructions (Addendum)
I found and removed one polyp - it looks benign. You also have diverticulosis - thickened muscle rings and pouches in the colon wall. Please read the handout about this condition.  I will let you know pathology results and when to have another routine colonoscopy by mail and/or My Chart.  Please remember to show the mole on your thigh to your primary care provider  I appreciate the opportunity to care for you. Gatha Mayer, MD, FACG  NO ASPIRIN,IBUPROFEN, NAPROXEN OR ANY OTHER ANTI-INFLAMMATORY MEDICATIONS FOR 2 WEEKS, MAY 16,2018.  Handouts given for POLYPS and DIVERTICULOSIS.  YOU HAD AN ENDOSCOPIC PROCEDURE TODAY AT [facility]:   Refer to the procedure report that was given to you for any specific questions about what was found during the examination.  If the procedure report does not answer your questions, please call your gastroenterologist to clarify.  If you requested that your care partner not be given the details of your procedure findings, then the procedure report has been included in a sealed envelope for you to review at your convenience later.  YOU SHOULD EXPECT: Some feelings of bloating in the abdomen. Passage of more gas than usual.  Walking can help get rid of the air that was put into your GI tract during the procedure and reduce the bloating. If you had a lower endoscopy (such as a colonoscopy or flexible sigmoidoscopy) you may notice spotting of blood in your stool or on the toilet paper. If you underwent a bowel prep for your procedure, you may not have a normal bowel movement for a few days.  Please Note:  You might notice some irritation and congestion in your nose or some drainage.  This is from the oxygen used during your procedure.  There is no need for concern and it should clear up in a day or so.  SYMPTOMS TO REPORT IMMEDIATELY:   Following lower endoscopy (colonoscopy or flexible sigmoidoscopy):  Excessive amounts of blood in the  stool  Significant tenderness or worsening of abdominal pains  Swelling of the abdomen that is new, acute  Fever of 100F or higher   For urgent or emergent issues, a gastroenterologist can be reached at any hour by calling [phone].   DIET:  We do recommend a small meal at first, but then you may proceed to your regular diet.  Drink plenty of fluids but you should avoid alcoholic beverages for 24 hours.  ACTIVITY:  You should plan to take it easy for the rest of today and you should NOT DRIVE or use heavy machinery until tomorrow (because of the sedation medicines used during the test).    FOLLOW UP: Our staff will call the number listed on your records the next business day following your procedure to check on you and address any questions or concerns that you may have regarding the information given to you following your procedure. If we do not reach you, we will leave a message.  However, if you are feeling well and you are not experiencing any problems, there is no need to return our call.  We will assume that you have returned to your regular daily activities without incident.  If any biopsies were taken you will be contacted by phone or by letter within the next 1-3 weeks.  Please call us at [phone] if you have not heard about the biopsies in 3 weeks.    SIGNATURES/CONFIDENTIALITY: You and/or your care partner have signed paperwork which  will be entered into your electronic medical record.  These signatures attest to the fact that that the information above on your After Visit Summary has been reviewed and is understood.  Full responsibility of the confidentiality of this discharge information lies with you and/or your care-partner.

## 2016-09-23 NOTE — Op Note (Signed)
[facility] Patient Name: Leroy Cook Procedure Date: 09/23/2016 10:21 AM MRN: [id number] Endoscopist: Gatha Mayer , MD Age: 61 Referring MD:  Date of Birth: 1956/01/02 Gender: Male Account #: [id number] Procedure:                Colonoscopy Indications:              Screening for colorectal malignant neoplasm Medicines:                Monitored Anesthesia Care Procedure:                Pre-Anesthesia Assessment:                           - Prior to the procedure, a History and Physical                            was performed, and patient medications and                            allergies were reviewed. The patient's tolerance of                            previous anesthesia was also reviewed. The risks                            and benefits of the procedure and the sedation                            options and risks were discussed with the patient.                            All questions were answered, and informed consent                            was obtained. Prior Anticoagulants: The patient                            last took aspirin on the day of the procedure. ASA                            Grade Assessment: II - A patient with mild systemic                            disease. After reviewing the risks and benefits,                            the patient was deemed in satisfactory condition to                            undergo the procedure.                           After obtaining informed consent, the colonoscope  was passed under direct vision. Throughout the                            procedure, the patient's blood pressure, pulse, and                            oxygen saturations were monitored continuously. The                            Colonoscope was introduced through the anus and                            advanced to the the cecum, identified by                            appendiceal orifice and ileocecal valve. The                             colonoscopy was performed without difficulty. The                            patient tolerated the procedure well. The quality                            of the bowel preparation was good. The bowel                            preparation used was Miralax. The ileocecal valve,                            appendiceal orifice, and rectum were photographed. Scope In: 10:35:22 AM Scope Out: 10:53:45 AM Scope Withdrawal Time: 0 hours 15 minutes 0 seconds  Total Procedure Duration: 0 hours 18 minutes 23 seconds  Findings:                 The perianal examination was normal.                           The digital rectal exam findings include decreased                            sphincter tone. Pertinent negatives include normal                            prostate (size, shape, and consistency).                           A 12 mm polyp was found in the rectum. The polyp                            was sessile. The polyp was removed with a hot                            snare. Autocut 120 and soft coag treatment  after                            that. Resection and retrieval were complete.                            Verification of patient identification for the                            specimen was done. Estimated blood loss: none.                           Many small and large-mouthed diverticula were found                            in the entire colon.                           The exam was otherwise without abnormality on                            direct and retroflexion views. Complications:            No immediate complications. Estimated Blood Loss:     Estimated blood loss: none. Impression:               - Decreased sphincter tone found on digital rectal                            exam.                           - One 12 mm polyp in the rectum, removed with a hot                            snare. Resected and retrieved.                           - Diverticulosis  in the entire examined colon.                           - The examination was otherwise normal on direct                            and retroflexion views. Recommendation:           - Patient has a contact number available for                            emergencies. The signs and symptoms of potential                            delayed complications were discussed with the                            patient. Return to normal activities tomorrow.  Written discharge instructions were provided to the                            patient.                           - Resume previous diet.                           - Continue present medications.                           - No aspirin, ibuprofen, naproxen, or other                            non-steroidal anti-inflammatory drugs for 2 weeks                            after polyp removal.                           - Repeat colonoscopy is recommended. The                            colonoscopy date will be determined after pathology                            results from today's exam become available for                            review.                           - Show mole on right thigh to PCP - has visit June 1 Gatha Mayer, MD 09/23/2016 11:03:15 AM This report has been signed electronically.

## 2016-09-23 NOTE — Progress Notes (Signed)
Report to PACU, RN, vss, BBS= Clear.  

## 2016-09-23 NOTE — Progress Notes (Signed)
Called to room to assist during endoscopic procedure.  Patient ID and intended procedure confirmed with present staff. Received instructions for my participation in the procedure from the performing physician.  

## 2016-09-24 ENCOUNTER — Telehealth: Payer: Self-pay

## 2016-09-24 NOTE — Telephone Encounter (Signed)
  Follow up Call-  Call back number 09/23/2016  Post procedure Call Back phone  # [phone]  Permission to leave phone message Yes     Patient questions:  Do you have a fever, pain , or abdominal swelling? No. Pain Score  0 *  Have you tolerated food without any problems? Yes.    Have you been able to return to your normal activities? Yes.    Do you have any questions about your discharge instructions: Diet   No. Medications  No. Follow up visit  No.  Do you have questions or concerns about your Care? No.  Actions: * If pain score is 4 or above: No action needed, pain <4.

## 2016-09-29 ENCOUNTER — Encounter: Payer: Self-pay | Admitting: Internal Medicine

## 2016-09-29 DIAGNOSIS — Z86010 Personal history of colonic polyps: Secondary | ICD-10-CM

## 2016-09-29 HISTORY — DX: Personal history of colonic polyps: Z86.010

## 2016-09-29 NOTE — Progress Notes (Signed)
Path specimen was mucous and neutrophils Clinically an adenoma was suspected but not in specimen 3 yr recall and path inquiry will be made

## 2016-10-23 ENCOUNTER — Encounter: Payer: Self-pay | Admitting: Family Medicine

## 2016-10-23 ENCOUNTER — Ambulatory Visit (INDEPENDENT_AMBULATORY_CARE_PROVIDER_SITE_OTHER): Payer: BLUE CROSS/BLUE SHIELD | Admitting: Family Medicine

## 2016-10-23 VITALS — BP 130/79 | HR 66 | Temp 98.3°F | Resp 16 | Ht 69.5 in | Wt 165.0 lb

## 2016-10-23 DIAGNOSIS — I69321 Dysphasia following cerebral infarction: Secondary | ICD-10-CM

## 2016-10-23 DIAGNOSIS — R030 Elevated blood-pressure reading, without diagnosis of hypertension: Secondary | ICD-10-CM

## 2016-10-23 DIAGNOSIS — L84 Corns and callosities: Secondary | ICD-10-CM

## 2016-10-23 DIAGNOSIS — Z8673 Personal history of transient ischemic attack (TIA), and cerebral infarction without residual deficits: Secondary | ICD-10-CM

## 2016-10-23 DIAGNOSIS — L918 Other hypertrophic disorders of the skin: Secondary | ICD-10-CM

## 2016-10-23 DIAGNOSIS — F17200 Nicotine dependence, unspecified, uncomplicated: Secondary | ICD-10-CM

## 2016-10-23 MED ORDER — ATORVASTATIN CALCIUM 10 MG PO TABS: 10.0000 mg | ORAL_TABLET | Freq: Every day | ORAL | 3 refills | Status: AC

## 2016-10-23 NOTE — Progress Notes (Signed)
Subjective:    Patient ID: Leroy Cook, male    DOB: 1956/03/11, 61 y.o.   MRN: [id number]  HPI Leroy Cook, a 61 year old male with a history of a left side CVA presents for a follow up of chronic conditions. He has a history of a CVA with left sided weakness. Leroy Cook He is complaining of difficulty forming words at time. He says that he has to try hard to form sentences since his stroke. Event occurred several years ago. He has residual symptoms of balance disturbance, slurred speech, inability to form sentences at times. He denies syncope, seizures, cognitive impairment. Overall he feels the condition is improved. Stroke risk factors include hyperlipidemia and smoking. He has not had a speech evaluation or physical therapy since relocating from [place]. He is not on statin therapy , but takes daily aspirin. He is a chronic everyday smoker. He says that he has decreased smoking to 3-4 cigarettes per day.   Past Medical History:  Diagnosis Date  . Arthritis   . Hx of colonic polyp 09/29/2016  . Stroke (cerebrum) ([place]) 10/2015   CVA x 2   Immunization History  Administered Date(s) Administered  . Pneumococcal Conjugate-13 07/23/2016  . Tdap 07/23/2016   Social History   Social History  . Marital status: Widowed    Spouse name: N/A  . Number of children: N/A  . Years of education: N/A   Occupational History  . Not on file.   Social History Main Topics  . Smoking status: Current Every Day Smoker    Packs/day: 0.25    Types: Cigarettes  . Smokeless tobacco: Never Used     Comment: 1 pack last 1 week per pt  . Alcohol use No  . Drug use: Yes    Types: Marijuana     Comment: smoke Marijuana every other day per pt  . Sexual activity: Not on file   Other Topics Concern  . Not on file   Social History Narrative  . No narrative on file   Depression screen [facility] 2/9 10/23/2016 07/23/2016 07/23/2016  Decreased Interest 0 1 0  Down, Depressed, Hopeless 0 1 0  PHQ - 2 Score 0 2  0  Altered sleeping - 1 -  Tired, decreased energy - 0 -  Change in appetite - 0 -  Feeling bad or failure about yourself  - 0 -  Trouble concentrating - 0 -  Moving slowly or fidgety/restless - 0 -  Suicidal thoughts - 0 -  PHQ-9 Score - 3 -   Review of Systems  Constitutional: Negative for fatigue and fever.  HENT: Negative.  Negative for congestion and dental problem.   Eyes: Negative.  Negative for photophobia and visual disturbance.  Respiratory: Negative.   Cardiovascular: Negative.   Gastrointestinal: Negative.   Endocrine: Negative.   Genitourinary: Negative.   Musculoskeletal: Negative.   Skin: Negative.   Allergic/Immunologic: Negative for food allergies and immunocompromised state.  Neurological: Positive for weakness (Left side weaknesss).  Hematological: Negative.   Psychiatric/Behavioral: Negative.        Objective:   Physical Exam  Constitutional: He is oriented to person, place, and time. He appears well-developed and well-nourished.  HENT:  Head: Normocephalic and atraumatic.  Right Ear: External ear normal.  Left Ear: External ear normal.  Nose: Nose normal.  Mouth/Throat: Oropharynx is clear and moist.  Eyes: Conjunctivae are normal. Pupils are equal, round, and reactive to light.  Neck: Normal range of motion. Neck  supple.  Cardiovascular: Normal rate, regular rhythm, normal heart sounds and intact distal pulses.   Pulmonary/Chest: Effort normal and breath sounds normal.  Abdominal: Soft. Bowel sounds are normal.  Genitourinary: Rectum normal and prostate normal. Prostate is not enlarged and not tender.  Musculoskeletal:       Left elbow: He exhibits decreased range of motion (3/5 weakness to left upper extrremity).  Neurological: He is alert and oriented to person, place, and time. He has normal reflexes. No cranial nerve deficit. He exhibits normal muscle tone. Coordination normal.  Skin: Skin is warm and dry.  Right 4th toe corn, hardened, tender  to palpation   Skin tag-Right inner thigh          BP 130/79 (BP Location: Left Arm, Patient Position: Sitting, Cuff Size: Normal)   Pulse 66   Temp 98.3 F (36.8 C) (Oral)   Resp 16   Ht 5' 9.5" (1.765 m)   Wt 165 lb (74.8 kg)   SpO2 100%   BMI 24.02 kg/m  Assessment & Plan:  1. History of CVA (cerebrovascular accident) Will continue daily aspirin 81 mg and statin therapy.  ASCVD 10 year risk is 15.7.  Continue to decrease smoking, he says that he is down to 3 cigarettes per day - atorvastatin (LIPITOR) 10 MG tablet; Take 1 tablet (10 mg total) by mouth daily.  Dispense: 90 tablet; Refill: 3  2. 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 He had a CVA several years ago. He has had difficulty with speech since that time. I will send a referral for a speech evaluation.  - Ambulatory referral to Speech Therapy  3. Borderline hypertension Blood pressure is 130/79 manually. He says that he exercised prior to arrival. He was advised to return in 1 week for a blood pressure check.   4. Tobacco dependence Smoking cessation instruction/counseling given:  counseled patient on the dangers of tobacco use, advised patient to stop smoking, and reviewed strategies to maximize success  5. Acquired skin tag Leroy Cook is to return in 2 weeks for a skin tag removal.   6. Corn of toe - Ambulatory referral to Podiatry   RTC: 2 weeks for skin tag removal   Donia Pounds  MSN, FNP-C [place], [place] [address] [phone]

## 2016-10-23 NOTE — Patient Instructions (Addendum)
History of stroke and elevated cholesterol:  Will start Atorvastatin (Lipitor) 10 mg every evening with dinner Continue Aspirin 81 mg daily Recommend a lowfat, low carbohydrate diet divided over 5-6 small meals, increase water intake to 6-8 glasses, and 150 minutes per week of cardiovascular exercise.    Right 4th toe corn:  Sent a referral to podiatry   Skin tag to right inner thigh:   Will schedule a follow up for removal  Speech difficulty:   Sent a referral to speech therapy for further workup and evaluation

## 2016-10-27 ENCOUNTER — Telehealth: Payer: Self-pay

## 2016-10-27 ENCOUNTER — Other Ambulatory Visit: Payer: Self-pay | Admitting: Family Medicine

## 2016-10-27 DIAGNOSIS — R1319 Other dysphagia: Secondary | ICD-10-CM

## 2016-10-27 DIAGNOSIS — I6930 Unspecified sequelae of cerebral infarction: Secondary | ICD-10-CM

## 2016-10-27 NOTE — Telephone Encounter (Signed)
Called both numbers on chart, no answer and no way to leave a message. If patient returns call I need to make him aware of Barium Swallow Study which is scheduled for 6/14/208 (Thursday) at [facility] at 1pm. Patient will need to arrive 15 minutes earlier.

## 2016-10-28 NOTE — Telephone Encounter (Signed)
Called and spoke with patient, advised of appointment for the swallowing study for 11/05/2016@1pm .

## 2016-10-30 ENCOUNTER — Ambulatory Visit: Payer: BLUE CROSS/BLUE SHIELD

## 2016-10-30 DIAGNOSIS — Z0130 Encounter for examination of blood pressure without abnormal findings: Secondary | ICD-10-CM | POA: Insufficient documentation

## 2016-11-05 ENCOUNTER — Inpatient Hospital Stay (HOSPITAL_COMMUNITY): Admission: RE | Admit: 2016-11-05 | Payer: BLUE CROSS/BLUE SHIELD | Source: Ambulatory Visit

## 2016-11-05 ENCOUNTER — Ambulatory Visit (HOSPITAL_COMMUNITY): Admission: RE | Admit: 2016-11-05 | Payer: BLUE CROSS/BLUE SHIELD | Source: Ambulatory Visit

## 2016-11-23 ENCOUNTER — Ambulatory Visit: Payer: BLUE CROSS/BLUE SHIELD | Admitting: Family Medicine

## 2016-11-27 ENCOUNTER — Ambulatory Visit (HOSPITAL_COMMUNITY)
Admission: RE | Admit: 2016-11-27 | Discharge: 2016-11-27 | Disposition: A | Discharge status: Home / Self Care | Payer: BLUE CROSS/BLUE SHIELD | Source: Ambulatory Visit | Attending: Family Medicine | Admitting: Family Medicine

## 2016-11-27 DIAGNOSIS — K219 Gastro-esophageal reflux disease without esophagitis: Secondary | ICD-10-CM | POA: Diagnosis not present

## 2016-11-27 DIAGNOSIS — R1319 Other dysphagia: Secondary | ICD-10-CM | POA: Insufficient documentation

## 2016-11-27 DIAGNOSIS — M1990 Unspecified osteoarthritis, unspecified site: Secondary | ICD-10-CM | POA: Insufficient documentation

## 2016-11-27 DIAGNOSIS — Z8673 Personal history of transient ischemic attack (TIA), and cerebral infarction without residual deficits: Secondary | ICD-10-CM | POA: Diagnosis not present

## 2016-11-27 DIAGNOSIS — I6930 Unspecified sequelae of cerebral infarction: Secondary | ICD-10-CM | POA: Insufficient documentation

## 2016-11-27 DIAGNOSIS — Z86010 Personal history of colonic polyps: Secondary | ICD-10-CM | POA: Insufficient documentation

## 2016-12-05 ENCOUNTER — Telehealth: Payer: Self-pay | Admitting: Family Medicine

## 2016-12-05 MED ORDER — OMEPRAZOLE 20 MG PO CPDR: 20.0000 mg | DELAYED_RELEASE_CAPSULE | Freq: Every day | ORAL | 5 refills | Status: AC

## 2016-12-05 NOTE — Telephone Encounter (Signed)
Reviewed barium swallow study. Findings are consistent with mild gastroesophageal reflux. Will start a trial of omeprazole 20 mg daily.   Meds ordered this encounter  Medications  . omeprazole (PRILOSEC) 20 MG capsule    Sig: Take 1 capsule (20 mg total) by mouth daily.    Dispense:  30 capsule    Refill:  [place]  MSN, FNP-C [place] [address] [place], [place] [address] [phone]

## 2016-12-07 NOTE — Telephone Encounter (Signed)
LM on cell to CB

## 2016-12-10 NOTE — Telephone Encounter (Signed)
Reported all to patient.

## 2016-12-17 ENCOUNTER — Ambulatory Visit: Payer: BLUE CROSS/BLUE SHIELD | Admitting: Family Medicine

## 2017-12-19 IMAGING — CT CT HEAD W/O CM
3 of 4 series · 16 of 47 positions shown, 19 images · non-contrast
Comparison: None.

CLINICAL DATA: Vertigo. Dizziness leading to fall to floor.
Previous CVA.

EXAM:
CT HEAD WITHOUT CONTRAST
TECHNIQUE: Contiguous axial images were obtained from the base of the skull
through the vertex without intravenous contrast.

[Series 2: head w/o · axial · non-contrast · 0.46mm/px · z∈[-145,-10]mm · 10 of 33 slices shown, 13 images]
[im 3/33  brain]
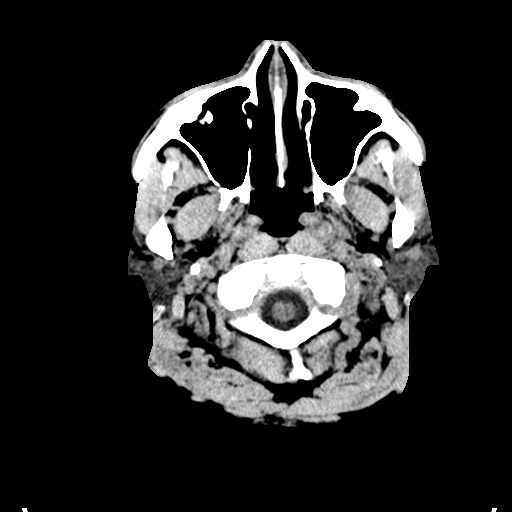
[im 3/33  bone]
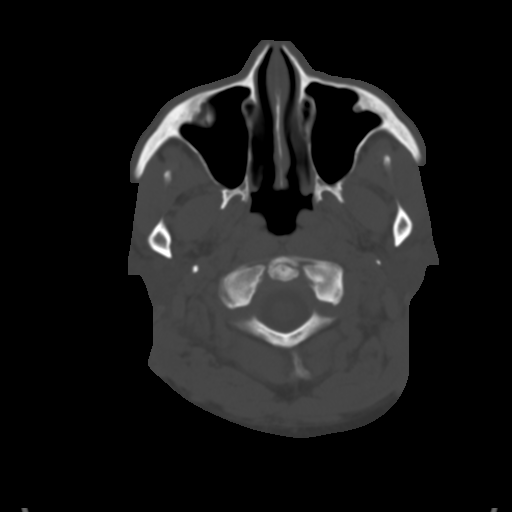
[im 5/33  brain]
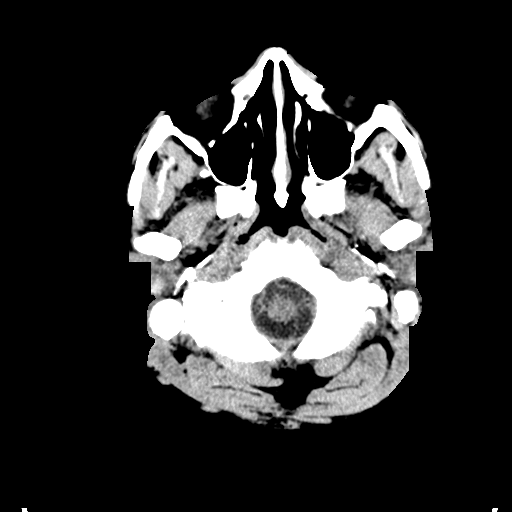
[im 10/33  brain]
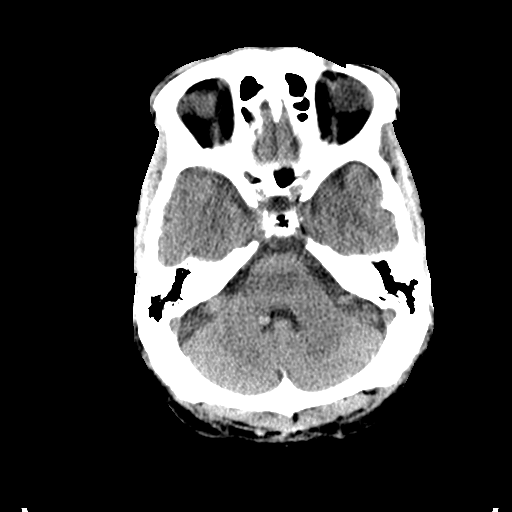
[im 12/33  brain]
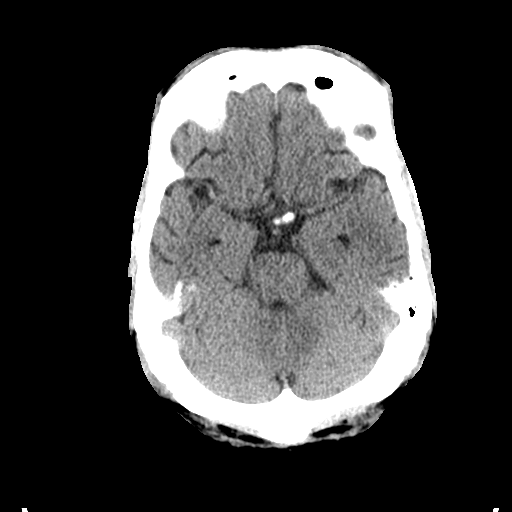
[im 14/33  brain]
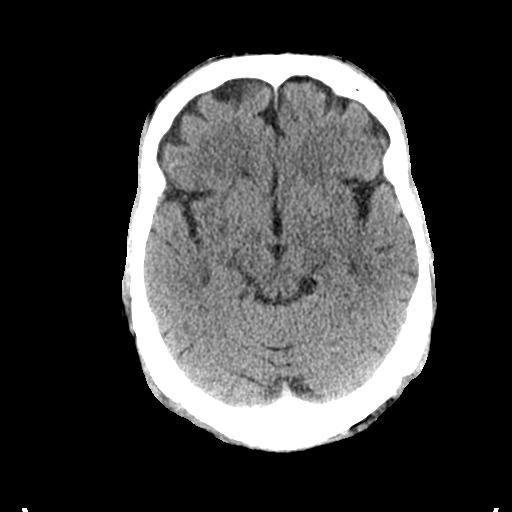
[im 14/33  bone]
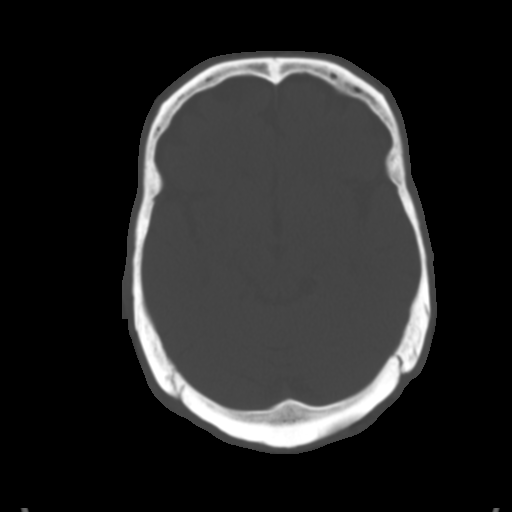
[im 19/33  brain]
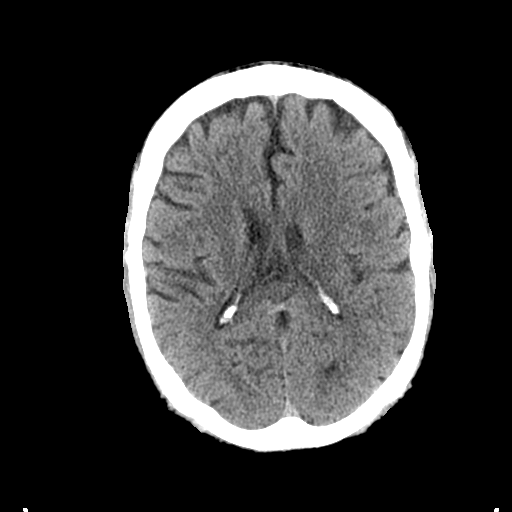
[im 21/33  brain]
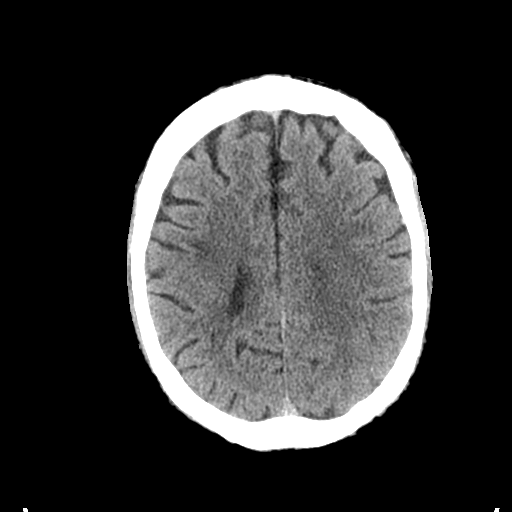
[im 23/33  brain]
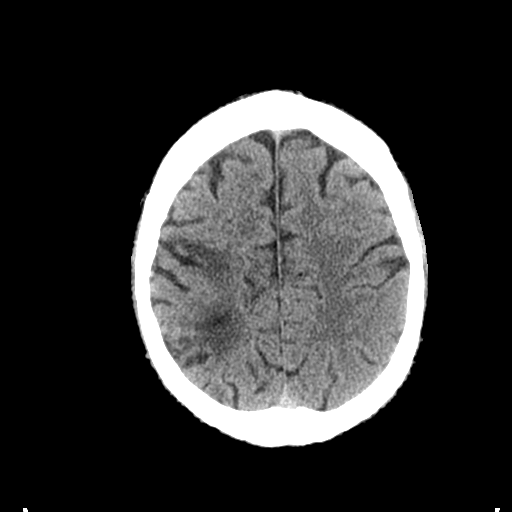
[im 28/33  brain]
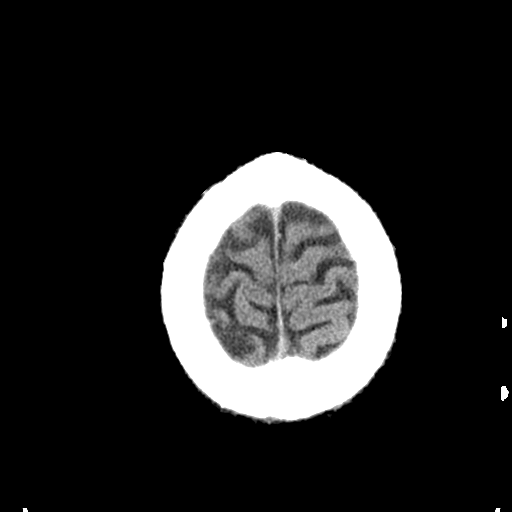
[im 28/33  bone]
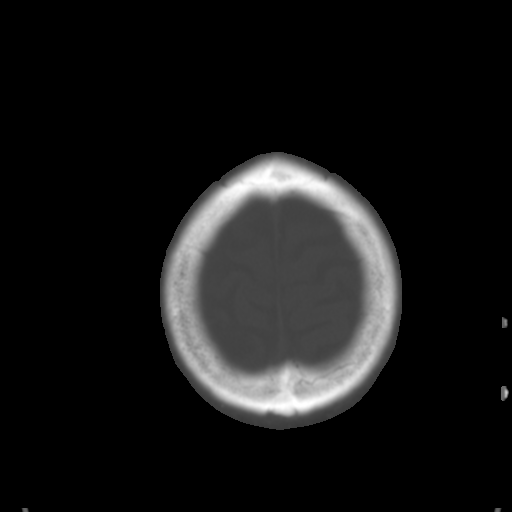
[im 30/33  brain]
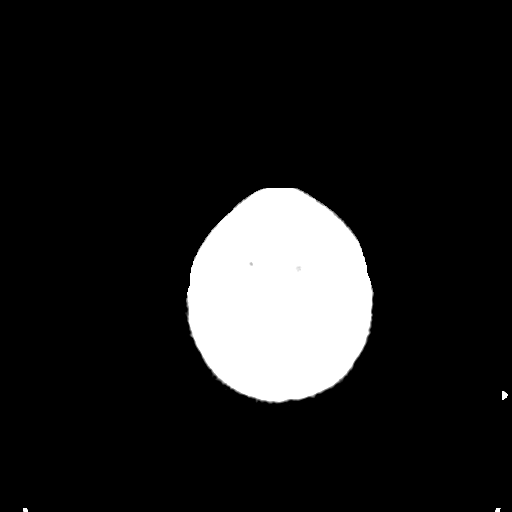

[Series 4: coronal · coronal · 0.31mm/px · 3 of 62 slices shown]
[im 21/62  brain]
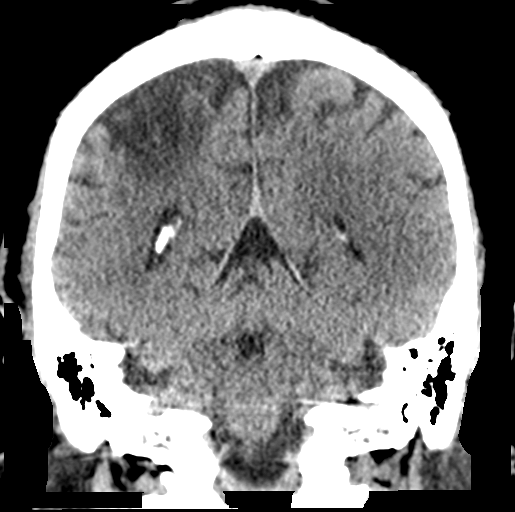
[im 28/62  brain]
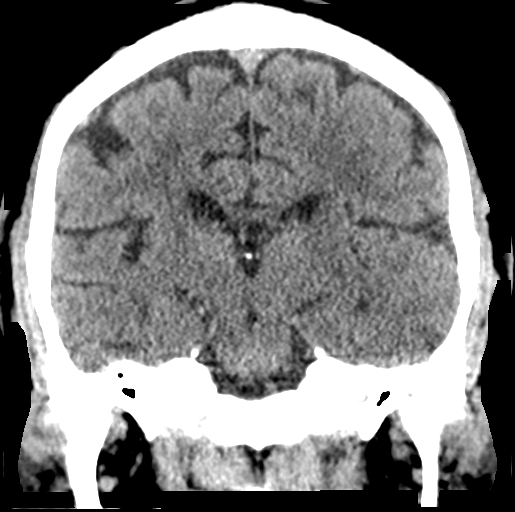
[im 34/62  brain]
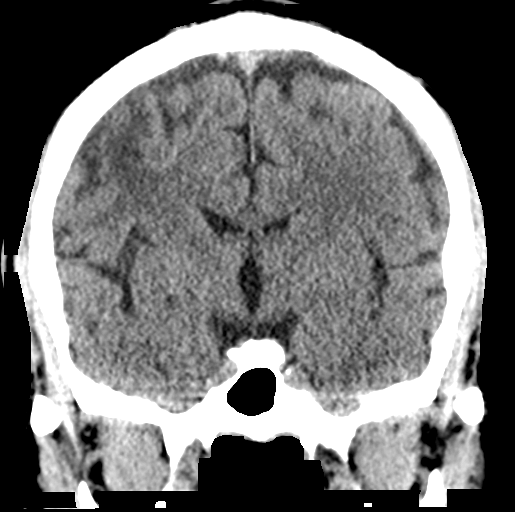

[Series 5: sagittal · sagittal · 0.31mm/px · 3 of 50 slices shown]
[im 17/50  brain]
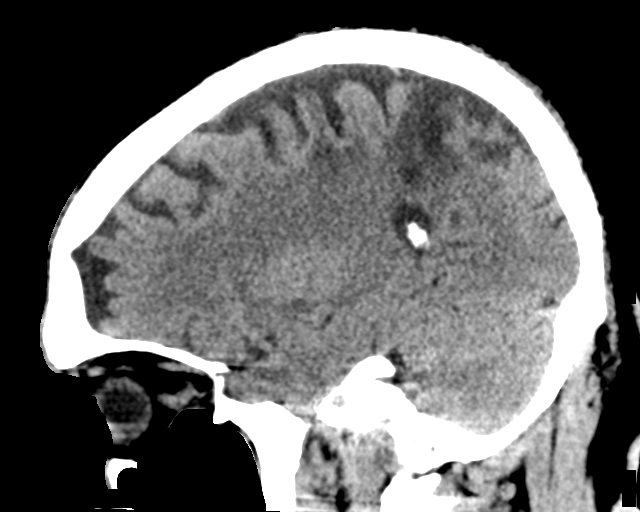
[im 25/50  brain]
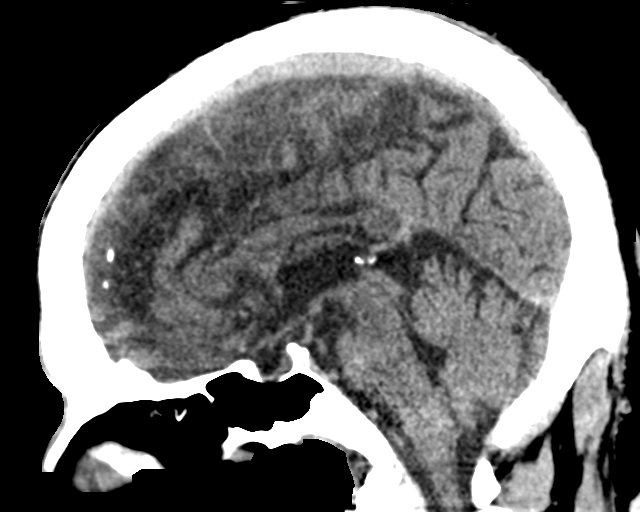
[im 33/50  brain]
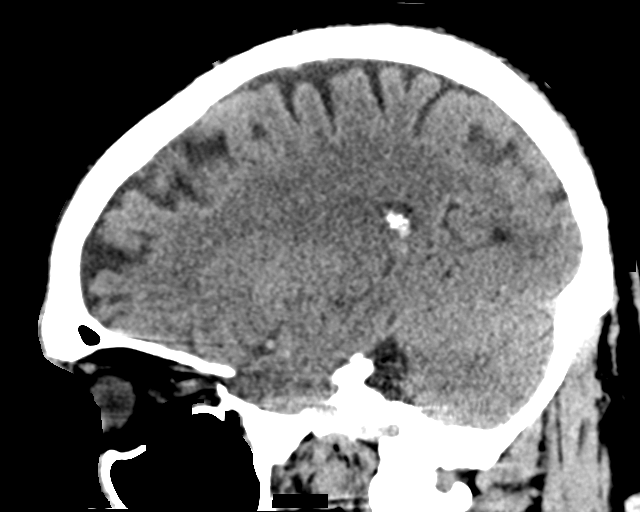

[16 of 47 positions shown; findings below may reference images not displayed]

FINDINGS: Brain: No evidence of acute infarction, hemorrhage, hydrocephalus,
extra-axial collection or mass lesion/mass effect. Mild generalized
atrophy and periventricular chronic small vessel ischemia.
Encephalomalacia in the right parietal and posterior frontal lobes.

Vascular: No hyperdense vessel or unexpected calcification.

Skull: Normal. Negative for fracture or focal lesion.

Sinuses/Orbits: Paranasal sinuses and mastoid air cells are clear.
The visualized orbits are unremarkable.

Other: None.
IMPRESSION: 1.  No acute intracranial abnormality.
2. Mild generalized atrophy and chronic small vessel ischemia. Right
parietal and posterior frontal encephalomalacia.

## 2021-09-11 ENCOUNTER — Encounter

## 2021-09-17 ENCOUNTER — Inpatient Hospital Stay: Payer: MEDICARE

## 2024-06-16 ENCOUNTER — Encounter: Payer: Medicare (Managed Care) | Attending: Orthopaedic Surgery

## 2024-06-29 ENCOUNTER — Encounter: Payer: Medicare (Managed Care) | Attending: Specialist

## 2024-07-21 ENCOUNTER — Encounter: Payer: Medicare (Managed Care) | Attending: Orthopaedic Surgery
